# Patient Record
Sex: Female | Born: 1990 | Race: Black or African American | Hispanic: No | Marital: Single | Smoking: Current every day smoker
Health system: Southern US, Community
[De-identification: ages and names within clinical notes are randomized; demographics above are authoritative.]

## PROBLEM LIST (undated history)

## (undated) ENCOUNTER — Inpatient Hospital Stay (HOSPITAL_COMMUNITY): Payer: Self-pay

## (undated) DIAGNOSIS — F419 Anxiety disorder, unspecified: Secondary | ICD-10-CM

## (undated) DIAGNOSIS — F32A Depression, unspecified: Secondary | ICD-10-CM

## (undated) DIAGNOSIS — J302 Other seasonal allergic rhinitis: Secondary | ICD-10-CM

## (undated) DIAGNOSIS — F329 Major depressive disorder, single episode, unspecified: Secondary | ICD-10-CM

## (undated) DIAGNOSIS — D649 Anemia, unspecified: Secondary | ICD-10-CM

## (undated) HISTORY — DX: Major depressive disorder, single episode, unspecified: F32.9

## (undated) HISTORY — DX: Anxiety disorder, unspecified: F41.9

## (undated) HISTORY — PX: NO PAST SURGERIES: SHX2092

## (undated) HISTORY — DX: Depression, unspecified: F32.A

## (undated) HISTORY — DX: Anemia, unspecified: D64.9

---
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
## 2012-05-31 ENCOUNTER — Emergency Department (INDEPENDENT_AMBULATORY_CARE_PROVIDER_SITE_OTHER)
Admission: EM | Admit: 2012-05-31 | Discharge: 2012-05-31 | Disposition: A | Discharge status: Home / Self Care | Payer: Medicaid Other | Source: Home / Self Care | Attending: Emergency Medicine | Admitting: Emergency Medicine

## 2012-05-31 ENCOUNTER — Encounter (HOSPITAL_COMMUNITY): Payer: Self-pay

## 2012-05-31 DIAGNOSIS — J029 Acute pharyngitis, unspecified: Secondary | ICD-10-CM

## 2012-05-31 LAB — POCT RAPID STREP A: Streptococcus, Group A Screen (Direct): NEGATIVE

## 2012-05-31 NOTE — ED Provider Notes (Signed)
History     CSN: [id number]  Arrival date & time 05/31/12  1811   First MD Initiated Contact with Patient 05/31/12 1855      Chief Complaint  Patient presents with  . Sore Throat    (Consider location/radiation/quality/duration/timing/severity/associated sxs/prior treatment) Patient is a 21 y.o. female presenting with pharyngitis. The history is provided by the patient.  Sore Throat This is a new problem. The current episode started more than 2 days ago. The problem occurs daily. The problem has been gradually improving. The symptoms are aggravated by swallowing and drinking (swallowing). Nothing relieves the symptoms. She has tried nothing for the symptoms.  hx of strep many years ago, states symptom are improving, throat feels scratchy in nature.  History reviewed. No pertinent past medical history.  History reviewed. No pertinent past surgical history.  History reviewed. No pertinent family history.  History  Substance Use Topics  . Smoking status: Not on file  . Smokeless tobacco: Not on file  . Alcohol Use: Yes    OB History    Grav Para Term Preterm Abortions TAB SAB Ect Mult Living                  Review of Systems  HENT: Positive for sore throat and trouble swallowing.   All other systems reviewed and are negative.    Allergies  Review of patient's allergies indicates no known allergies.  Home Medications  No current outpatient prescriptions on file.  BP 103/68  Pulse 72  Temp 98.2 F (36.8 C) (Oral)  Resp 24  SpO2 99%  LMP 05/22/2012  Physical Exam  Nursing note and vitals reviewed. Constitutional: She is oriented to person, place, and time. Vital signs are normal. She appears well-developed and well-nourished. She is active and cooperative.  HENT:  Head: Normocephalic.  Right Ear: Tympanic membrane and external ear normal.  Left Ear: Tympanic membrane and external ear normal.  Mouth/Throat: Uvula is midline and mucous membranes are normal.  Oropharyngeal exudate and posterior oropharyngeal edema present.       Minimal erythema  Eyes: Conjunctivae normal are normal. Pupils are equal, round, and reactive to light. No scleral icterus.  Neck: Trachea normal, normal range of motion and full passive range of motion without pain. Neck supple. No muscular tenderness present.  Cardiovascular: Normal rate, regular rhythm, normal heart sounds, intact distal pulses and normal pulses.   Pulmonary/Chest: Effort normal and breath sounds normal.  Lymphadenopathy:       Head (right side): No submental, no submandibular, no tonsillar, no preauricular, no posterior auricular and no occipital adenopathy present.       Head (left side): No submental, no submandibular, no tonsillar, no preauricular, no posterior auricular and no occipital adenopathy present.    She has no cervical adenopathy.  Neurological: She is alert and oriented to person, place, and time. No cranial nerve deficit or sensory deficit.  Skin: Skin is warm and dry.  Psychiatric: She has a normal mood and affect. Her speech is normal and behavior is normal. Judgment and thought content normal. Cognition and memory are normal.    ED Course  Procedures (including critical care time)   Labs Reviewed  POCT RAPID STREP A ([facility] CARE ONLY)  STREP A DNA PROBE   No results found.   1. Pharyngitis       MDM  Rapid strep negative, throat culture sent.  Increase fluids, salt water gargles, antihistamine.        Avey Mcmanamon L  Benisha Hadaway, NP 05/31/12 4098

## 2012-05-31 NOTE — ED Notes (Signed)
"  I think I have strep" ; NAD

## 2012-05-31 NOTE — ED Provider Notes (Signed)
Medical screening examination/treatment/procedure(s) were performed by non-physician practitioner and as supervising physician I was immediately available for consultation/collaboration.  Raynald Blend, MD 05/31/12 2025

## 2012-09-26 ENCOUNTER — Emergency Department (HOSPITAL_COMMUNITY)
Admission: EM | Admit: 2012-09-26 | Discharge: 2012-09-26 | Disposition: A | Discharge status: Home / Self Care | Payer: Medicaid Other | Attending: Emergency Medicine | Admitting: Emergency Medicine

## 2012-09-26 ENCOUNTER — Emergency Department (HOSPITAL_COMMUNITY): Payer: Medicaid Other

## 2012-09-26 ENCOUNTER — Encounter (HOSPITAL_COMMUNITY): Payer: Self-pay

## 2012-09-26 DIAGNOSIS — M25579 Pain in unspecified ankle and joints of unspecified foot: Secondary | ICD-10-CM | POA: Insufficient documentation

## 2012-09-26 DIAGNOSIS — M79672 Pain in left foot: Secondary | ICD-10-CM

## 2012-09-26 NOTE — ED Notes (Signed)
Ortho tech paged  

## 2012-09-26 NOTE — ED Provider Notes (Signed)
History     CSN: [id number]  Arrival date & time 09/26/12  4098   First MD Initiated Contact with Patient 09/26/12 0840      Chief Complaint  Patient presents with  . Foot Pain    (Consider location/radiation/quality/duration/timing/severity/associated sxs/prior treatment) Patient is a 22 y.o. female presenting with lower extremity pain. The history is provided by the patient. No language interpreter was used.  Foot Pain This is a new (pain over mid 4th, 5th metatarsals) problem. The current episode started in the past 7 days. The problem occurs constantly. The problem has been gradually worsening. Pertinent negatives include no abdominal pain, arthralgias, chest pain, chills, congestion, coughing, diaphoresis, fatigue, fever, headaches, nausea, neck pain, numbness, rash, sore throat, vomiting or weakness. The symptoms are aggravated by walking and standing. She has tried nothing for the symptoms. The treatment provided no relief.    History reviewed. No pertinent past medical history.  History reviewed. No pertinent past surgical history.  No family history on file.  History  Substance Use Topics  . Smoking status: Not on file  . Smokeless tobacco: Not on file  . Alcohol Use: Yes    OB History   Grav Para Term Preterm Abortions TAB SAB Ect Mult Living                  Review of Systems  Constitutional: Negative for fever, chills, diaphoresis, activity change, appetite change and fatigue.  HENT: Negative for congestion, sore throat, facial swelling, rhinorrhea, neck pain and neck stiffness.   Eyes: Negative for photophobia and discharge.  Respiratory: Negative for cough, chest tightness and shortness of breath.   Cardiovascular: Negative for chest pain, palpitations and leg swelling.  Gastrointestinal: Negative for nausea, vomiting, abdominal pain and diarrhea.  Endocrine: Negative for polydipsia and polyuria.  Genitourinary: Negative for dysuria, frequency, difficulty  urinating and pelvic pain.  Musculoskeletal: Negative for back pain and arthralgias.  Skin: Negative for color change, rash and wound.  Allergic/Immunologic: Negative for immunocompromised state.  Neurological: Negative for facial asymmetry, weakness, numbness and headaches.  Hematological: Does not bruise/bleed easily.  Psychiatric/Behavioral: Negative for confusion and agitation.    Allergies  Review of patient's allergies indicates no known allergies.  Home Medications  No current outpatient prescriptions on file.  BP 103/72  Pulse 72  Temp(Src) 97.8 F (36.6 C) (Oral)  Resp 20  SpO2 100%  LMP 09/18/2012  Physical Exam  Constitutional: She is oriented to person, place, and time. She appears well-developed and well-nourished. No distress.  HENT:  Head: Normocephalic and atraumatic.  Mouth/Throat: No oropharyngeal exudate.  Eyes: Pupils are equal, round, and reactive to light.  Neck: Normal range of motion. Neck supple.  Cardiovascular: Normal rate, regular rhythm and normal heart sounds.  Exam reveals no gallop and no friction rub.   No murmur heard. Pulmonary/Chest: Effort normal and breath sounds normal. No respiratory distress. She has no wheezes. She has no rales.  Abdominal: Soft. Bowel sounds are normal. She exhibits no distension and no mass. There is no tenderness. There is no rebound and no guarding.  Musculoskeletal: Normal range of motion. She exhibits tenderness. She exhibits no edema.       Feet:  Neurological: She is alert and oriented to person, place, and time.  Skin: Skin is warm and dry.  Psychiatric: She has a normal mood and affect.    ED Course  Procedures (including critical care time)  Labs Reviewed - No data to display Dg Foot Complete  Left  09/26/2012  *RADIOLOGY REPORT*  Clinical Data: Pain over mid fourth/fifth metatarsals, no known injury  LEFT FOOT - COMPLETE 3+ VIEW  Comparison: None.  Findings: No fracture or dislocation is seen.  The  joint spaces are preserved.  The visualized soft tissues are unremarkable.  IMPRESSION: No fracture or dislocation is seen.   Original Report Authenticated By: Charline Bills, M.D.      1. Left foot pain       MDM  Pt is a 22 y.o. female with no pertinent PMHX who presents with 4 days of worsening L midfoot pain.  No known injury, but pt on feet at work & does a lot of walking.  No fever, s/sx of infection.  +ttp over 4th & 5th L metatarsal.  XR negative.  Doubt acute infection, DVT, foreign body.  Will d/c home in post-op boot, recommend NSAIDs and f/u in 1 week is pain persists as cannot r/o small stress fracture.    1. Left foot pain      Labs and imaging considered in decision making, reviewed by myself.  Imaging interpreted by radiology. Pt care discussed with my attending, Dr. Hyacinth Meeker.    Toy Cookey, MD 09/26/12 [phone]

## 2012-09-26 NOTE — Progress Notes (Signed)
Orthopedic Tech Progress Note Patient Details:  Maurie Musco [place] 23-Sep-1990 [id number] Post op shoe applied to Left foot. Tolerated well.  Ortho Devices Type of Ortho Device: Postop shoe/boot Ortho Device/Splint Location: Left Ortho Device/Splint Interventions: Application   Asia R Thompson 09/26/2012, 10:33 AM

## 2012-09-26 NOTE — ED Notes (Addendum)
Pt states she walks a lot and when she woke up this morning she noticed swelling to the left foot.  Pt denies any injury.

## 2012-09-26 NOTE — ED Provider Notes (Signed)
I saw and evaluated the patient, reviewed the resident's note and I agree with the findings and plan.  Pt with L foot pain on the dorsum of the foot - mild pain, worse with light touch and ambulation - no fever, no swelling, no redness, no other c/o.  No hx of trauma.  Imaging neg for fracture.  Stable for d/c.  Vida Roller, MD 09/26/12 2147

## 2014-02-17 ENCOUNTER — Encounter (HOSPITAL_COMMUNITY): Payer: Self-pay | Admitting: Emergency Medicine

## 2014-02-17 ENCOUNTER — Emergency Department (HOSPITAL_COMMUNITY)
Admission: EM | Admit: 2014-02-17 | Discharge: 2014-02-17 | Discharge status: Against Medical Advice | Payer: Medicaid Other | Attending: Emergency Medicine | Admitting: Emergency Medicine

## 2014-02-17 DIAGNOSIS — S90569A Insect bite (nonvenomous), unspecified ankle, initial encounter: Secondary | ICD-10-CM | POA: Insufficient documentation

## 2014-02-17 DIAGNOSIS — Y9389 Activity, other specified: Secondary | ICD-10-CM | POA: Diagnosis not present

## 2014-02-17 DIAGNOSIS — Y9289 Other specified places as the place of occurrence of the external cause: Secondary | ICD-10-CM | POA: Insufficient documentation

## 2014-02-17 DIAGNOSIS — W57XXXA Bitten or stung by nonvenomous insect and other nonvenomous arthropods, initial encounter: Principal | ICD-10-CM

## 2014-02-17 NOTE — ED Notes (Signed)
Unable to locate pt to take to treatment room. Found pt's pager lying on counter with BP cuff.

## 2014-02-17 NOTE — ED Notes (Signed)
C/o "spider" bite to L thigh x 45 min.  States she did not see "spider" but felt it bite her.  Area slightly red.

## 2014-03-21 ENCOUNTER — Encounter (HOSPITAL_COMMUNITY): Payer: Self-pay | Admitting: Emergency Medicine

## 2014-03-21 ENCOUNTER — Emergency Department (HOSPITAL_COMMUNITY)
Admission: EM | Admit: 2014-03-21 | Discharge: 2014-03-21 | Disposition: A | Discharge status: Home / Self Care | Payer: Medicaid Other | Attending: Emergency Medicine | Admitting: Emergency Medicine

## 2014-03-21 DIAGNOSIS — L0291 Cutaneous abscess, unspecified: Secondary | ICD-10-CM

## 2014-03-21 DIAGNOSIS — F17200 Nicotine dependence, unspecified, uncomplicated: Secondary | ICD-10-CM | POA: Insufficient documentation

## 2014-03-21 DIAGNOSIS — L02419 Cutaneous abscess of limb, unspecified: Secondary | ICD-10-CM | POA: Insufficient documentation

## 2014-03-21 DIAGNOSIS — L03119 Cellulitis of unspecified part of limb: Principal | ICD-10-CM

## 2014-03-21 MED ORDER — NAPROXEN 500 MG PO TABS: 500.0000 mg | ORAL_TABLET | Freq: Two times a day (BID) | ORAL | Status: DC

## 2014-03-21 NOTE — ED Notes (Signed)
C/o "boil" right posterior thigh. Large swollen area to posterior thigh.

## 2014-03-21 NOTE — ED Provider Notes (Signed)
CSN: [id number]     Arrival date & time 03/21/14  0949 History  This chart was scribed for non-physician practitioner, Rhea Bleacher, PA-C working with Flint Melter, MD by Greggory Stallion, ED scribe. This patient was seen in room TR08C/TR08C and the patient's care was started at 9:59 AM.    Chief Complaint  Patient presents with  . Abscess   The history is provided by the patient. No language interpreter was used.   HPI Comments: Michelle Shah is a 23 y.o. female who presents to the Emergency Department complaining of an abscess to her right thigh that started 3 days ago. Reports pain is worsened with pressure. She has not done anything for it yet. Reports some drainage. Pt has history of abscesses but has never had them I&D'd. Denies fever, nausea, emesis.   History reviewed. No pertinent past medical history. History reviewed. No pertinent past surgical history. No family history on file. History  Substance Use Topics  . Smoking status: Current Every Day Smoker  . Smokeless tobacco: Not on file  . Alcohol Use: Yes   OB History   Grav Para Term Preterm Abortions TAB SAB Ect Mult Living                 Review of Systems  Constitutional: Negative for fever.  HENT: Negative for congestion.   Eyes: Negative for redness.  Respiratory: Negative for shortness of breath.   Cardiovascular: Negative for chest pain.  Gastrointestinal: Negative for nausea and vomiting.  Musculoskeletal: Negative for gait problem.  Skin: Positive for color change.       Abscess  Neurological: Negative for speech difficulty.  Psychiatric/Behavioral: Negative for confusion.   Allergies  Review of patient's allergies indicates no known allergies.  Home Medications   Prior to Admission medications   Not on File   BP 126/60  Pulse 93  Temp(Src) 98.2 F (36.8 C) (Oral)  SpO2 100%  LMP 02/19/2014  Physical Exam  Nursing note and vitals reviewed. Constitutional: She is oriented to person, place,  and time. She appears well-developed and well-nourished. No distress.  HENT:  Head: Normocephalic and atraumatic.  Eyes: Conjunctivae and EOM are normal.  Neck: Neck supple. No tracheal deviation present.  Cardiovascular: Normal rate.   Pulmonary/Chest: Effort normal. No respiratory distress.  Musculoskeletal: Normal range of motion.  Neurological: She is alert and oriented to person, place, and time.  Skin: Skin is warm and dry.  2 cm area of induration right medial thigh with out surrounding cellulitis consistent with abscess.  Psychiatric: She has a normal mood and affect. Her behavior is normal.    ED Course  Procedures (including critical care time)  INCISION AND DRAINAGE Performed by: Rhea Bleacher, PA-C Consent: Verbal consent obtained. Risks and benefits: risks, benefits and alternatives were discussed Type: abscess  Body area: right thigh  Anesthesia: local infiltration  Incision was made with a scalpel.  Local anesthetic: lidocaine 2% with epinephrine  Anesthetic total: 4 ml  Complexity: complex Blunt dissection to break up loculations  Drainage: purulent  Drainage amount: small; abscess previously draining spontaneously  Packing material: none  Patient tolerance: Patient tolerated the procedure well with no immediate complications.   DIAGNOSTIC STUDIES: Oxygen Saturation is 100% on RA, normal by my interpretation.    COORDINATION OF CARE: 10:01 AM-Discussed treatment plan which includes abscess with pt at bedside and pt agreed to plan.   Labs Review Labs Reviewed - No data to display  Imaging Review No results  found.   EKG Interpretation None      Vital signs reviewed and are as follows: Filed Vitals:   03/21/14 0957  BP: 126/60  Pulse: 93  Temp: 98.2 F (36.8 C)     10:40 AM The patient was urged to return to the Emergency Department urgently with worsening pain, swelling, expanding erythema especially if it streaks away from the  affected area, fever, or if they have any other concerns.   The patient was urged to return to the Emergency Department or go to their PCP in 48 hours for wound recheck if the area is not significantly improved.  The patient verbalized understanding and stated agreement with this plan.      MDM   Final diagnoses:  Abscess   Patient with skin abscess amenable to incision and drainage.No signs of cellulitis is surrounding skin. No antibiotic therapy is indicated.  I personally performed the services described in this documentation, which was scribed in my presence. The recorded information has been reviewed and is accurate.  Renne Crigler, PA-C 03/21/14 1041

## 2014-03-21 NOTE — Discharge Instructions (Signed)
Please read and follow all provided instructions.  Your diagnoses today include:  1. Abscess     Tests performed today include:  Vital signs. See below for your results today.   Medications prescribed:   Naproxen - anti-inflammatory pain medication  Do not exceed  naproxen every 12 hours, take with food  You have been prescribed an anti-inflammatory medication or NSAID. Take with food. Take smallest effective dose for the shortest duration needed for your pain. Stop taking if you experience stomach pain or vomiting.   Take any prescribed medications only as directed.   Home care instructions:   Follow any educational materials contained in this packet  Follow-up instructions: Return to the Emergency Department in 48 hours for a recheck if your symptoms are not significantly improved.  Please follow-up with your primary care provider in the next 1 week for further evaluation of your symptoms.   Return instructions:  Return to the Emergency Department if you have:  Fever  Worsening symptoms  Worsening pain  Worsening swelling  Redness of the skin that moves away from the affected area, especially if it streaks away from the affected area   Any other emergent concerns   Your vital signs today were: BP 126/60   Pulse 93   Temp(Src) 98.2 F (36.8 C) (Oral)   SpO2 100%   LMP 02/19/2014 If your blood pressure (BP) was elevated above 135/85 this visit, please have this repeated by your doctor within one month. --------------

## 2014-03-21 NOTE — ED Provider Notes (Signed)
Medical screening examination/treatment/procedure(s) were performed by non-physician practitioner and as supervising physician I was immediately available for consultation/collaboration.   EKG Interpretation None       Flint Melter, MD 03/21/14 2020

## 2014-10-28 ENCOUNTER — Encounter (HOSPITAL_COMMUNITY): Payer: Self-pay | Admitting: *Deleted

## 2014-10-28 ENCOUNTER — Emergency Department (HOSPITAL_COMMUNITY)
Admission: EM | Admit: 2014-10-28 | Discharge: 2014-10-28 | Discharge status: Against Medical Advice | Payer: Medicaid Other | Attending: Emergency Medicine | Admitting: Emergency Medicine

## 2014-10-28 DIAGNOSIS — Z720 Tobacco use: Secondary | ICD-10-CM | POA: Insufficient documentation

## 2014-10-28 DIAGNOSIS — Z202 Contact with and (suspected) exposure to infections with a predominantly sexual mode of transmission: Secondary | ICD-10-CM | POA: Insufficient documentation

## 2014-10-28 LAB — URINALYSIS, ROUTINE W REFLEX MICROSCOPIC
BILIRUBIN URINE: NEGATIVE
GLUCOSE, UA: NEGATIVE mg/dL
Hgb urine dipstick: NEGATIVE
Ketones, ur: NEGATIVE mg/dL
Nitrite: NEGATIVE
PH: 6.5 (ref 5.0–8.0)
PROTEIN: NEGATIVE mg/dL
Specific Gravity, Urine: 1.029 (ref 1.005–1.030)
UROBILINOGEN UA: 1 mg/dL (ref 0.0–1.0)

## 2014-10-28 LAB — URINE MICROSCOPIC-ADD ON

## 2014-10-28 LAB — POC URINE PREG, ED: Preg Test, Ur: NEGATIVE

## 2014-10-28 NOTE — ED Notes (Signed)
Pt left before being seen. Pt sts she had to get to work.

## 2014-10-28 NOTE — ED Notes (Signed)
Pt reports unprotected sex in march, reports now her vaginal area feels "funny" x3 weeks. Reports burning feeling, but denies pain. Denies vaginal discharge.

## 2014-12-05 ENCOUNTER — Emergency Department (HOSPITAL_COMMUNITY)
Admission: EM | Admit: 2014-12-05 | Discharge: 2014-12-05 | Disposition: A | Discharge status: Home / Self Care | Payer: Medicaid Other | Attending: Emergency Medicine | Admitting: Emergency Medicine

## 2014-12-05 ENCOUNTER — Encounter (HOSPITAL_COMMUNITY): Payer: Self-pay

## 2014-12-05 DIAGNOSIS — Z791 Long term (current) use of non-steroidal anti-inflammatories (NSAID): Secondary | ICD-10-CM | POA: Diagnosis not present

## 2014-12-05 DIAGNOSIS — Z720 Tobacco use: Secondary | ICD-10-CM | POA: Insufficient documentation

## 2014-12-05 DIAGNOSIS — J029 Acute pharyngitis, unspecified: Secondary | ICD-10-CM | POA: Diagnosis present

## 2014-12-05 HISTORY — DX: Other seasonal allergic rhinitis: J30.2

## 2014-12-05 MED ORDER — AMOXICILLIN 250 MG PO CHEW: 500.0000 mg | CHEWABLE_TABLET | Freq: Three times a day (TID) | ORAL | Status: DC

## 2014-12-05 MED ORDER — LIDOCAINE VISCOUS 2 % MT SOLN
15.0000 mL | Freq: Once | OROMUCOSAL | Status: AC
  Administered 2014-12-05: 15 mL via OROMUCOSAL
  Filled 2014-12-05: qty 15

## 2014-12-05 MED ORDER — LIDOCAINE VISCOUS 2 % MT SOLN: 20.0000 mL | OROMUCOSAL | Status: DC | PRN

## 2014-12-05 NOTE — ED Notes (Signed)
PA Abigail at the bedside.  

## 2014-12-05 NOTE — ED Notes (Signed)
Patient calm. States the medication helped. Breathing normal. No acute distress at this time.

## 2014-12-05 NOTE — Discharge Instructions (Signed)

## 2014-12-05 NOTE — ED Notes (Signed)
Per Patient, Patient started to have a cold about a week and a half ago complaints containing running nose, congestion, and sneezing. About a week ago, patient states she started to have a dry productive cough. Starting about four days ago, patient started to have pain in her throat that has continued to get worse. Patient no longer has cough, but complains of pain when swallowing.

## 2014-12-05 NOTE — ED Notes (Signed)
Walked in to patient room. Patient standing in the corner of the room crying and asked for a paper bag. Patient encouraged to slow breathing down and sit down in the chair. Patient made aware she is going to be given something for pain.

## 2014-12-05 NOTE — ED Provider Notes (Signed)
CSN: [id number]     Arrival date & time 12/05/14  1042 History  This chart was scribed for non-physician practitioner working, Arthor CaptainAbigail Maris Abascal, PA-C, with Benjiman CoreNathan Pickering, MD, by Modena JanskyAlbert Thayil, ED Scribe. This patient was seen in room TR08C/TR08C and the patient's care was started at 11:18 AM.  Chief Complaint  Patient presents with  . Sore Throat   The history is provided by the patient. No language interpreter was used.   HPI Comments: Michelle Shah is a 24 y.o. female who presents to the Emergency Department complaining of constant moderate sore throat that started 4 days ago. She states that she recently had a URI that is now resolved, but then later developed a sore throat with an intermittent mild subjective fever. Pt's temperature in the ED today was 97.8. She reports that swallowing exacerbates the pain. She states that the pain is worse in the morning. She denies any hx of strep throat.   No past medical history on file. No past surgical history on file. No family history on file. History  Substance Use Topics  . Smoking status: Current Every Day Smoker  . Smokeless tobacco: Not on file  . Alcohol Use: Yes     Comment: weekly   OB History    No data available     Review of Systems  Constitutional: Positive for fever.  HENT: Positive for sore throat.     Allergies  Review of patient's allergies indicates no known allergies.  Home Medications   Prior to Admission medications   Medication Sig Start Date End Date Taking? Authorizing Provider  naproxen (NAPROSYN) 500 MG tablet Take 1 tablet (500 mg total) by mouth 2 (two) times daily. 03/21/14   Renne CriglerJoshua Geiple, PA-C   BP 109/65 mmHg  Pulse 65  Temp(Src) 97.8 F (36.6 C) (Oral)  Resp 14  SpO2 100%  LMP 12/04/2014 Physical Exam  Constitutional: She is oriented to person, place, and time. She appears well-developed and well-nourished. No distress.  HENT:  Head: Normocephalic and atraumatic.  Mouth/Throat: Uvula is  midline. No trismus in the jaw. Oropharyngeal exudate and posterior oropharyngeal erythema present.  Tolerating own secretions.   Neck: Neck supple. No tracheal deviation present.  Cardiovascular: Normal rate.   Pulmonary/Chest: Effort normal. No respiratory distress.  Musculoskeletal: Normal range of motion.  Lymphadenopathy:    She has cervical adenopathy.  Neurological: She is alert and oriented to person, place, and time.  Skin: Skin is warm and dry.  Psychiatric: She has a normal mood and affect. Her behavior is normal.  Nursing note and vitals reviewed.   ED Course  Procedures (including critical care time) DIAGNOSTIC STUDIES: Oxygen Saturation is 100% on RA, normal by my interpretation.    COORDINATION OF CARE: 11:22 AM- Pt advised of plan for treatment which includes medication and pt agrees.  Labs Review Labs Reviewed - No data to display  Imaging Review No results found.   EKG Interpretation None      MDM   Final diagnoses:  Pharyngitis   Patient with sore throat, low grade temp, cervical adenopathy and throat covered in exudate. Will treat with amoxil. Patient pain improved with viscous lidocaine. Discussed return precautions  I personally performed the services described in this documentation, which was scribed in my presence. The recorded information has been reviewed and is accurate.       Arthor CaptainAbigail Sundy Houchins, PA-C 12/17/14 1924  Arthor CaptainAbigail Ezma Rehm, PA-C 12/17/14 1925  Benjiman CoreNathan Pickering, MD 12/18/14 [phone]

## 2015-07-20 NOTE — L&D Delivery Note (Signed)
Operative Delivery Note At 7:42 AM a viable female was delivered via Vaginal, [employer], operational).  Presentation: vertex; Position: Occiput,, Posterior; Station: +4.  Verbal consent: obtained from patient.  Risks and benefits discussed in detail.  Risks include, but are not limited to the risks of anesthesia, bleeding, infection, damage to maternal tissues, fetal cephalhematoma.  There is also the risk of inability to effect vaginal delivery of the head, or shoulder dystocia that cannot be resolved by established maneuvers, leading to the need for emergency cesarean section.  APGAR: 7, 9; weight  .   Placenta status:spont , .   Cord:  with the following complications: .  Cord pH: n/a  Anesthesia:  Epidural/local Instruments: kiwi Episiotomy: None Lacerations: 2nd degree Suture Repair: 2.0 vicryl rapide Est. Blood Loss (mL):    Mom to postpartum.  Baby to Couplet care / Skin to Skin.  Michelle Shah 02/16/2016, 8:03 AM

## 2015-07-30 ENCOUNTER — Emergency Department (HOSPITAL_COMMUNITY): Payer: Medicaid Other

## 2015-07-30 ENCOUNTER — Encounter (HOSPITAL_COMMUNITY): Payer: Self-pay | Admitting: Emergency Medicine

## 2015-07-30 ENCOUNTER — Emergency Department (HOSPITAL_COMMUNITY)
Admission: EM | Admit: 2015-07-30 | Discharge: 2015-07-30 | Disposition: A | Discharge status: Home / Self Care | Payer: Medicaid Other | Attending: Emergency Medicine | Admitting: Emergency Medicine

## 2015-07-30 DIAGNOSIS — Z3A09 9 weeks gestation of pregnancy: Secondary | ICD-10-CM | POA: Insufficient documentation

## 2015-07-30 DIAGNOSIS — S3991XA Unspecified injury of abdomen, initial encounter: Secondary | ICD-10-CM | POA: Insufficient documentation

## 2015-07-30 DIAGNOSIS — S20212A Contusion of left front wall of thorax, initial encounter: Secondary | ICD-10-CM | POA: Insufficient documentation

## 2015-07-30 DIAGNOSIS — Z792 Long term (current) use of antibiotics: Secondary | ICD-10-CM | POA: Insufficient documentation

## 2015-07-30 DIAGNOSIS — O9A211 Injury, poisoning and certain other consequences of external causes complicating pregnancy, first trimester: Secondary | ICD-10-CM | POA: Diagnosis present

## 2015-07-30 DIAGNOSIS — Y9389 Activity, other specified: Secondary | ICD-10-CM | POA: Diagnosis not present

## 2015-07-30 DIAGNOSIS — Y92410 Unspecified street and highway as the place of occurrence of the external cause: Secondary | ICD-10-CM | POA: Insufficient documentation

## 2015-07-30 DIAGNOSIS — Y998 Other external cause status: Secondary | ICD-10-CM | POA: Insufficient documentation

## 2015-07-30 DIAGNOSIS — F17200 Nicotine dependence, unspecified, uncomplicated: Secondary | ICD-10-CM | POA: Diagnosis not present

## 2015-07-30 DIAGNOSIS — S1093XA Contusion of unspecified part of neck, initial encounter: Secondary | ICD-10-CM | POA: Diagnosis not present

## 2015-07-30 DIAGNOSIS — R10A0 Flank pain, unspecified side: Secondary | ICD-10-CM

## 2015-07-30 DIAGNOSIS — R109 Unspecified abdominal pain: Secondary | ICD-10-CM

## 2015-07-30 DIAGNOSIS — O99331 Smoking (tobacco) complicating pregnancy, first trimester: Secondary | ICD-10-CM | POA: Diagnosis not present

## 2015-07-30 LAB — CBC WITH DIFFERENTIAL/PLATELET
Basophils Absolute: 0 K/uL (ref 0.0–0.1)
Basophils Relative: 0 %
Eosinophils Absolute: 0.1 K/uL (ref 0.0–0.7)
Eosinophils Relative: 1 %
HCT: 33.1 % — ABNORMAL LOW (ref 36.0–46.0)
Hemoglobin: 11.1 g/dL — ABNORMAL LOW (ref 12.0–15.0)
Lymphocytes Relative: 18 %
Lymphs Abs: 1.7 K/uL (ref 0.7–4.0)
MCH: 27.8 pg (ref 26.0–34.0)
MCHC: 33.5 g/dL (ref 30.0–36.0)
MCV: 82.8 fL (ref 78.0–100.0)
Monocytes Absolute: 0.5 K/uL (ref 0.1–1.0)
Monocytes Relative: 5 %
Neutro Abs: 7.2 K/uL (ref 1.7–7.7)
Neutrophils Relative %: 76 %
Platelets: 240 K/uL (ref 150–400)
RBC: 4 MIL/uL (ref 3.87–5.11)
RDW: 12.8 % (ref 11.5–15.5)
WBC: 9.4 K/uL (ref 4.0–10.5)

## 2015-07-30 LAB — BASIC METABOLIC PANEL WITH GFR
Anion gap: 8 (ref 5–15)
BUN: 6 mg/dL (ref 6–20)
CO2: 20 mmol/L — ABNORMAL LOW (ref 22–32)
Calcium: 8.9 mg/dL (ref 8.9–10.3)
Chloride: 107 mmol/L (ref 101–111)
Creatinine, Ser: 0.51 mg/dL (ref 0.44–1.00)
GFR calc Af Amer: >60 mL/min
GFR calc non Af Amer: >60 mL/min
Glucose, Bld: 85 mg/dL (ref 65–99)
Potassium: 3.8 mmol/L (ref 3.5–5.1)
Sodium: 135 mmol/L (ref 135–145)

## 2015-07-30 LAB — HCG, QUANTITATIVE, PREGNANCY: hCG, Beta Chain, Quant, S: 31335 m[IU]/mL — ABNORMAL HIGH

## 2015-07-30 LAB — POC URINE PREG, ED: Preg Test, Ur: POSITIVE — AB

## 2015-07-30 MED ORDER — ACETAMINOPHEN 500 MG PO TABS
1000.0000 mg | ORAL_TABLET | Freq: Once | ORAL | Status: AC
  Administered 2015-07-30: 1000 mg via ORAL
  Filled 2015-07-30: qty 2

## 2015-07-30 MED ORDER — MORPHINE SULFATE (PF) 4 MG/ML IV SOLN
4.0000 mg | Freq: Once | INTRAVENOUS | Status: AC
  Administered 2015-07-30: 4 mg via INTRAVENOUS
  Filled 2015-07-30: qty 1

## 2015-07-30 MED ORDER — ACETAMINOPHEN 500 MG PO TABS: 500.0000 mg | ORAL_TABLET | Freq: Once | ORAL | Status: DC

## 2015-07-30 MED ORDER — METOCLOPRAMIDE HCL 5 MG/ML IJ SOLN
5.0000 mg | Freq: Once | INTRAMUSCULAR | Status: AC
  Administered 2015-07-30: 5 mg via INTRAVENOUS
  Filled 2015-07-30: qty 2

## 2015-07-30 MED ORDER — METOCLOPRAMIDE HCL 10 MG PO TABS: 10.0000 mg | ORAL_TABLET | Freq: Four times a day (QID) | ORAL | Status: DC | PRN

## 2015-07-30 MED ORDER — PRENATAL COMPLETE 14-0.4 MG PO TABS: 1.0000 | ORAL_TABLET | ORAL | Status: DC

## 2015-07-30 NOTE — ED Notes (Addendum)
Pt. Found in hall way bed tearful and c/o chest pain. EDP notified. EDP verbally ordered morphine for pain.
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
## 2015-07-30 NOTE — ED Notes (Signed)
Pt refused wheelchair.  
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
## 2015-07-30 NOTE — ED Notes (Signed)
Patient transported to MRI at this time. 

## 2015-08-08 ENCOUNTER — Inpatient Hospital Stay (HOSPITAL_COMMUNITY)
Admission: AD | Admit: 2015-08-08 | Discharge: 2015-08-08 | Disposition: A | Discharge status: Home / Self Care | Payer: Medicaid Other | Source: Ambulatory Visit | Attending: Obstetrics and Gynecology | Admitting: Obstetrics and Gynecology

## 2015-08-08 ENCOUNTER — Encounter (HOSPITAL_COMMUNITY): Payer: Self-pay | Admitting: Advanced Practice Midwife

## 2015-08-08 ENCOUNTER — Inpatient Hospital Stay (HOSPITAL_COMMUNITY): Payer: Medicaid Other

## 2015-08-08 DIAGNOSIS — O99331 Smoking (tobacco) complicating pregnancy, first trimester: Secondary | ICD-10-CM | POA: Diagnosis not present

## 2015-08-08 DIAGNOSIS — M542 Cervicalgia: Secondary | ICD-10-CM | POA: Diagnosis present

## 2015-08-08 DIAGNOSIS — S299XXA Unspecified injury of thorax, initial encounter: Secondary | ICD-10-CM | POA: Insufficient documentation

## 2015-08-08 DIAGNOSIS — N63 Unspecified lump in breast: Secondary | ICD-10-CM | POA: Diagnosis not present

## 2015-08-08 DIAGNOSIS — S298XXD Other specified injuries of thorax, subsequent encounter: Secondary | ICD-10-CM

## 2015-08-08 DIAGNOSIS — O26891 Other specified pregnancy related conditions, first trimester: Secondary | ICD-10-CM | POA: Insufficient documentation

## 2015-08-08 DIAGNOSIS — N6310 Unspecified lump in the right breast, unspecified quadrant: Secondary | ICD-10-CM

## 2015-08-08 DIAGNOSIS — R079 Chest pain, unspecified: Secondary | ICD-10-CM

## 2015-08-08 DIAGNOSIS — S299XXD Unspecified injury of thorax, subsequent encounter: Secondary | ICD-10-CM

## 2015-08-08 DIAGNOSIS — Z3A10 10 weeks gestation of pregnancy: Secondary | ICD-10-CM | POA: Insufficient documentation

## 2015-08-08 DIAGNOSIS — O9A211 Injury, poisoning and certain other consequences of external causes complicating pregnancy, first trimester: Secondary | ICD-10-CM

## 2015-08-08 LAB — URINALYSIS, ROUTINE W REFLEX MICROSCOPIC
Bilirubin Urine: NEGATIVE
Glucose, UA: NEGATIVE mg/dL
Hgb urine dipstick: NEGATIVE
KETONES UR: 15 mg/dL — AB
Nitrite: NEGATIVE
PROTEIN: NEGATIVE mg/dL
Specific Gravity, Urine: >1.03 — ABNORMAL HIGH (ref 1.005–1.030)
pH: 6.5 (ref 5.0–8.0)

## 2015-08-08 LAB — URINE MICROSCOPIC-ADD ON

## 2015-08-08 MED ORDER — TRAMADOL HCL 50 MG PO TABS: 50.0000 mg | ORAL_TABLET | Freq: Four times a day (QID) | ORAL | Status: DC | PRN

## 2015-08-08 MED ORDER — CYCLOBENZAPRINE HCL 10 MG PO TABS: 10.0000 mg | ORAL_TABLET | Freq: Three times a day (TID) | ORAL | Status: DC | PRN

## 2015-08-08 MED ORDER — CYCLOBENZAPRINE HCL 10 MG PO TABS
10.0000 mg | ORAL_TABLET | Freq: Once | ORAL | Status: AC
  Administered 2015-08-08: 10 mg via ORAL
  Filled 2015-08-08: qty 1

## 2015-08-08 NOTE — Discharge Instructions (Signed)
________________________________________     To schedule your Maternity Eligibility Appointment, please call [phone].  When you arrive for your appointment you must bring the following items or information listed below.  Your appointment will be rescheduled if you do not have these items or are 15 minutes late. If currently receiving Medicaid, you MUST bring: 1. Medicaid Card 2. Social Security Card 3. Picture ID 4. Proof of Pregnancy 5. Verification of current address if the address on Medicaid card is incorrect "postmarked mail" If not receiving Medicaid, you MUST bring: 1. Social Security Card 2. Picture ID 3. Birth Certificate (if available) Passport or *Green Card 4. Proof of Pregnancy 5. Verification of current address "postmarked mail" for each income presented. 6. Verification of insurance coverage, if any 7. Check stubs from each employer for the previous month (if unable to present check stub  for each week, we will accept check stub for the first and last week ill the same month.) If you can't locate check stubs, you must bring a letter from the employer(s) and it must have the following information on letterhead, typed, in English: o name of company o company telephone number o how long been with the company, if less than one month o how much person earns per hour o how many hours per week work o the gross pay the person earned for the previous month If you are 23 years old or less, you do not have to bring proof of income unless you work or live with the father of the baby and at that time we will need proof of income from you and/or the father of the baby. Green Card recipients are eligible for Medicaid for Pregnant Women (MPW)  Chest Wall Pain Chest wall pain is pain in or around the bones and muscles of your chest. Sometimes, an injury causes this pain. Sometimes, the cause may not be known. This pain may take several weeks or longer to get better. HOME CARE  INSTRUCTIONS  Pay attention to any changes in your symptoms. Take these actions to help with your pain:   Rest as told by your health care provider.   Avoid activities that cause pain. These include any activities that use your chest muscles or your abdominal and side muscles to lift heavy items.   If directed, apply ice to the painful area:  Put ice in a plastic bag.  Place a towel between your skin and the bag.  Leave the ice on for 20 minutes, 2-3 times per day.  Take over-the-counter and prescription medicines only as told by your health care provider.  Do not use tobacco products, including cigarettes, chewing tobacco, and e-cigarettes. If you need help quitting, ask your health care provider.  Keep all follow-up visits as told by your health care provider. This is important. SEEK MEDICAL CARE IF:  You have a fever.  Your chest pain becomes worse.  You have new symptoms. SEEK IMMEDIATE MEDICAL CARE IF:  You have nausea or vomiting.  You feel sweaty or light-headed.  You have a cough with phlegm (sputum) or you cough up blood.  You develop shortness of breath.   This information is not intended to replace advice given to you by your health care provider. Make sure you discuss any questions you have with your health care provider.   Document Released: 07/05/2005 Document Revised: 03/26/2015 Document Reviewed: 09/30/2014 Elsevier Interactive Patient Education 2016 [employer] Inc. Breast Cyst A breast cyst is a sac in the  breast that is filled with fluid. Breast cysts are common in women. Women can have one or many cysts. When the breasts contain many cysts, it is usually due to a noncancerous (benign) condition called fibrocystic change. These lumps form under the influence of female hormones (estrogen and progesterone). The lumps are most often located in the upper, outer portion of the breast. They are often more swollen, painful, and tender before your period starts.  They usually disappear after menopause, unless you are on hormone therapy.  There are several types of cysts:  Macrocyst. This is a cyst that is about 2 in. (5.1 cm) in diameter.   Microcyst. This is a tiny cyst that you cannot feel but can be seen with a mammogram or an ultrasound.   Galactocele. This is a cyst containing milk that may develop if you suddenly stop breastfeeding.   Sebaceous cyst of the skin. This type of cyst is not in the breast tissue itself. Breast cysts do not increase your risk of breast cancer. However, they must be monitored closely because they can be cancerous.  CAUSES  It is not known exactly what causes a breast cyst to form. Possible causes include:  An overgrowth of milk glands and connective tissue in the breast can block the milk glands, causing them to fill with fluid.   Scar tissue in the breast from previous surgery may block the glands, causing a cyst.  RISK FACTORS Estrogen may influence the development of a breast cyst.  SIGNS AND SYMPTOMS   Feeling a smooth, round, soft lump (like a grape) in the breast that is easily moveable.   Breast discomfort or pain.  Increase in size of the lump before your menstrual period and decrease in its size after your menstrual period.  DIAGNOSIS  A cyst can be felt during a physical exam by your health care provider. A breast X-ray exam (mammogram) and ultrasonography will be done to confirm the diagnosis. Fluid may be removed from the cyst with a needle (fine needle aspiration) to make sure the cyst is not cancerous.  TREATMENT  Treatment may not be necessary. Your health care provider may monitor the cyst to see if it goes away on its own. If treatment is needed, it may include:  Hormone treatment.   Needle aspiration. There is a chance of the cyst coming back after aspiration.   Surgery to remove the whole cyst.  HOME CARE INSTRUCTIONS   Keep all follow-up appointments with your health care  provider.  See your health care provider regularly:  Get a yearly exam by your health care provider.  Have a clinical breast exam by a health care provider every 1-3 years if you are 23-66 years of age. After age 23 years, you should have the exam every year.   Get mammogram tests as directed by your health care provider.   Understand the normal appearance and feel of your breasts and perform breast self-exams.   Only take over-the-counter or prescription medicines as directed by your health care provider.   Wear a supportive bra, especially when exercising.   Avoid caffeine.   Reduce your salt intake, especially before your menstrual period. Too much salt can cause fluid retention, breast swelling, and discomfort.  SEEK MEDICAL CARE IF:   You feel, or think you feel, a lump in your breast.   You notice that both breasts look or feel different than usual.   Your breast is still causing pain after your menstrual period  is over.   You need medicine for breast pain and swelling that occurs with your menstrual period.  SEEK IMMEDIATE MEDICAL CARE IF:   You have severe pain, tenderness, redness, or warmth in your breast.   You have nipple discharge or bleeding.   Your breast lump becomes hard and painful.   You find new lumps or bumps that were not there before.   You feel lumps in your armpit (axilla).   You notice dimpling or wrinkling of the breast or nipple.   You have a fever.  MAKE SURE YOU:  Understand these instructions.  Will watch your condition.  Will get help right away if you are not doing well or get worse.   This information is not intended to replace advice given to you by your health care provider. Make sure you discuss any questions you have with your health care provider.   Document Released: 07/05/2005 Document Revised: 03/07/2013 Document Reviewed: 02/01/2013 Elsevier Interactive Patient Education [employer]
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
## 2015-09-04 ENCOUNTER — Encounter: Payer: Self-pay | Admitting: Obstetrics and Gynecology

## 2015-09-04 ENCOUNTER — Ambulatory Visit (INDEPENDENT_AMBULATORY_CARE_PROVIDER_SITE_OTHER): Payer: Self-pay | Admitting: Obstetrics and Gynecology

## 2015-09-04 VITALS — BP 113/64 | HR 94 | Temp 98.7°F | Wt 195.4 lb

## 2015-09-04 DIAGNOSIS — Z1151 Encounter for screening for human papillomavirus (HPV): Secondary | ICD-10-CM

## 2015-09-04 DIAGNOSIS — Z124 Encounter for screening for malignant neoplasm of cervix: Secondary | ICD-10-CM

## 2015-09-04 DIAGNOSIS — Z113 Encounter for screening for infections with a predominantly sexual mode of transmission: Secondary | ICD-10-CM

## 2015-09-04 DIAGNOSIS — Z3400 Encounter for supervision of normal first pregnancy, unspecified trimester: Secondary | ICD-10-CM | POA: Insufficient documentation

## 2015-09-04 DIAGNOSIS — Z3402 Encounter for supervision of normal first pregnancy, second trimester: Secondary | ICD-10-CM

## 2015-09-04 LAB — POCT URINALYSIS DIP (DEVICE)
Bilirubin Urine: NEGATIVE
GLUCOSE, UA: NEGATIVE mg/dL
Hgb urine dipstick: NEGATIVE
KETONES UR: NEGATIVE mg/dL
Nitrite: NEGATIVE
PROTEIN: NEGATIVE mg/dL
Urobilinogen, UA: 0.2 mg/dL (ref 0.0–1.0)
pH: 6.5 (ref 5.0–8.0)

## 2015-09-04 LAB — OB RESULTS CONSOLE GC/CHLAMYDIA: GC PROBE AMP, GENITAL: NEGATIVE

## 2015-09-04 NOTE — Patient Instructions (Signed)
Second Trimester of Pregnancy The second trimester is from week 13 through week 28, months 4 through 6. The second trimester is often a time when you feel your best. Your body has also adjusted to being pregnant, and you begin to feel better physically. Usually, morning sickness has lessened or quit completely, you may have more energy, and you may have an increase in appetite. The second trimester is also a time when the fetus is growing rapidly. At the end of the sixth month, the fetus is about 9 inches long and weighs about 1 pounds. You will likely begin to feel the baby move (quickening) between 18 and 20 weeks of the pregnancy. BODY CHANGES Your body goes through many changes during pregnancy. The changes vary from woman to woman.   Your weight will continue to increase. You will notice your lower abdomen bulging out.  You may begin to get stretch marks on your hips, abdomen, and breasts.  You may develop headaches that can be relieved by medicines approved by your health care provider.  You may urinate more often because the fetus is pressing on your bladder.  You may develop or continue to have heartburn as a result of your pregnancy.  You may develop constipation because certain hormones are causing the muscles that push waste through your intestines to slow down.  You may develop hemorrhoids or swollen, bulging veins (varicose veins).  You may have back pain because of the weight gain and pregnancy hormones relaxing your joints between the bones in your pelvis and as a result of a shift in weight and the muscles that support your balance.  Your breasts will continue to grow and be tender.  Your gums may bleed and may be sensitive to brushing and flossing.  Dark spots or blotches (chloasma, mask of pregnancy) may develop on your face. This will likely fade after the baby is born.  A dark line from your belly button to the pubic area (linea nigra) may appear. This will likely  fade after the baby is born.  You may have changes in your hair. These can include thickening of your hair, rapid growth, and changes in texture. Some women also have hair loss during or after pregnancy, or hair that feels dry or thin. Your hair will most likely return to normal after your baby is born. WHAT TO EXPECT AT YOUR PRENATAL VISITS During a routine prenatal visit:  You will be weighed to make sure you and the fetus are growing normally.  Your blood pressure will be taken.  Your abdomen will be measured to track your baby's growth.  The fetal heartbeat will be listened to.  Any test results from the previous visit will be discussed. Your health care provider may ask you:  How you are feeling.  If you are feeling the baby move.  If you have had any abnormal symptoms, such as leaking fluid, bleeding, severe headaches, or abdominal cramping.  If you are using any tobacco products, including cigarettes, chewing tobacco, and electronic cigarettes.  If you have any questions. Other tests that may be performed during your second trimester include:  Blood tests that check for:  Low iron levels (anemia).  Gestational diabetes (between 24 and 28 weeks).  Rh antibodies.  Urine tests to check for infections, diabetes, or protein in the urine.  An ultrasound to confirm the proper growth and development of the baby.  An amniocentesis to check for possible genetic problems.  Fetal screens for spina bifida   and Down syndrome.  HIV (human immunodeficiency virus) testing. Routine prenatal testing includes screening for HIV, unless you choose not to have this test. HOME CARE INSTRUCTIONS   Avoid all smoking, herbs, alcohol, and unprescribed drugs. These chemicals affect the formation and growth of the baby.  Do not use any tobacco products, including cigarettes, chewing tobacco, and electronic cigarettes. If you need help quitting, ask your health care provider. You may receive  counseling support and other resources to help you quit.  Follow your health care provider's instructions regarding medicine use. There are medicines that are either safe or unsafe to take during pregnancy.  Exercise only as directed by your health care provider. Experiencing uterine cramps is a good sign to stop exercising.  Continue to eat regular, healthy meals.  Wear a good support bra for breast tenderness.  Do not use hot tubs, steam rooms, or saunas.  Wear your seat belt at all times when driving.  Avoid raw meat, uncooked cheese, cat litter boxes, and soil used by cats. These carry germs that can cause birth defects in the baby.  Take your prenatal vitamins.  Take 1500-2000 mg of calcium daily starting at the 20th week of pregnancy until you deliver your baby.  Try taking a stool softener (if your health care provider approves) if you develop constipation. Eat more high-fiber foods, such as fresh vegetables or fruit and whole grains. Drink plenty of fluids to keep your urine clear or pale yellow.  Take warm sitz baths to soothe any pain or discomfort caused by hemorrhoids. Use hemorrhoid cream if your health care provider approves.  If you develop varicose veins, wear support hose. Elevate your feet for 15 minutes, 3-4 times a day. Limit salt in your diet.  Avoid heavy lifting, wear low heel shoes, and practice good posture.  Rest with your legs elevated if you have leg cramps or low back pain.  Visit your dentist if you have not gone yet during your pregnancy. Use a soft toothbrush to brush your teeth and be gentle when you floss.  A sexual relationship may be continued unless your health care provider directs you otherwise.  Continue to go to all your prenatal visits as directed by your health care provider. SEEK MEDICAL CARE IF:   You have dizziness.  You have mild pelvic cramps, pelvic pressure, or nagging pain in the abdominal area.  You have persistent nausea,  vomiting, or diarrhea.  You have a bad smelling vaginal discharge.  You have pain with urination. SEEK IMMEDIATE MEDICAL CARE IF:   You have a fever.  You are leaking fluid from your vagina.  You have spotting or bleeding from your vagina.  You have severe abdominal cramping or pain.  You have rapid weight gain or loss.  You have shortness of breath with chest pain.  You notice sudden or extreme swelling of your face, hands, ankles, feet, or legs.  You have not felt your baby move in over an hour.  You have severe headaches that do not go away with medicine.  You have vision changes.   This information is not intended to replace advice given to you by your health care provider. Make sure you discuss any questions you have with your health care provider.   Document Released: 06/29/2001 Document Revised: 07/26/2014 Document Reviewed: 09/05/2012 Elsevier Interactive Patient Education 2016 [employer]  Contraception Choices Contraception (birth control) is the use of any methods or devices to prevent pregnancy. Below are some methods to   help avoid pregnancy. HORMONAL METHODS   Contraceptive implant. This is a thin, plastic tube containing progesterone hormone. It does not contain estrogen hormone. Your health care provider inserts the tube in the inner part of the upper arm. The tube can remain in place for up to 3 years. After 3 years, the implant must be removed. The implant prevents the ovaries from releasing an egg (ovulation), thickens the cervical mucus to prevent sperm from entering the uterus, and thins the lining of the inside of the uterus.  Progesterone-only injections. These injections are given every 3 months by your health care provider to prevent pregnancy. This synthetic progesterone hormone stops the ovaries from releasing eggs. It also thickens cervical mucus and changes the uterine lining. This makes it harder for sperm to survive in the uterus.  Birth  control pills. These pills contain estrogen and progesterone hormone. They work by preventing the ovaries from releasing eggs (ovulation). They also cause the cervical mucus to thicken, preventing the sperm from entering the uterus. Birth control pills are prescribed by a health care provider.Birth control pills can also be used to treat heavy periods.  Minipill. This type of birth control pill contains only the progesterone hormone. They are taken every day of each month and must be prescribed by your health care provider.  Birth control patch. The patch contains hormones similar to those in birth control pills. It must be changed once a week and is prescribed by a health care provider.  Vaginal ring. The ring contains hormones similar to those in birth control pills. It is left in the vagina for 3 weeks, removed for 1 week, and then a new one is put back in place. The patient must be comfortable inserting and removing the ring from the vagina.A health care provider's prescription is necessary.  Emergency contraception. Emergency contraceptives prevent pregnancy after unprotected sexual intercourse. This pill can be taken right after sex or up to 5 days after unprotected sex. It is most effective the sooner you take the pills after having sexual intercourse. Most emergency contraceptive pills are available without a prescription. Check with your pharmacist. Do not use emergency contraception as your only form of birth control. BARRIER METHODS   Female condom. This is a thin sheath (latex or rubber) that is worn over the penis during sexual intercourse. It can be used with spermicide to increase effectiveness.  Female condom. This is a soft, loose-fitting sheath that is put into the vagina before sexual intercourse.  Diaphragm. This is a soft, latex, dome-shaped barrier that must be fitted by a health care provider. It is inserted into the vagina, along with a spermicidal jelly. It is inserted before  intercourse. The diaphragm should be left in the vagina for 6 to 8 hours after intercourse.  Cervical cap. This is a round, soft, latex or plastic cup that fits over the cervix and must be fitted by a health care provider. The cap can be left in place for up to 48 hours after intercourse.  Sponge. This is a soft, circular piece of polyurethane foam. The sponge has spermicide in it. It is inserted into the vagina after wetting it and before sexual intercourse.  Spermicides. These are chemicals that kill or block sperm from entering the cervix and uterus. They come in the form of creams, jellies, suppositories, foam, or tablets. They do not require a prescription. They are inserted into the vagina with an applicator before having sexual intercourse. The process must be repeated every   time you have sexual intercourse. INTRAUTERINE CONTRACEPTION  Intrauterine device (IUD). This is a T-shaped device that is put in a woman's uterus during a menstrual period to prevent pregnancy. There are 2 types:  Copper IUD. This type of IUD is wrapped in copper wire and is placed inside the uterus. Copper makes the uterus and fallopian tubes produce a fluid that kills sperm. It can stay in place for 10 years.  Hormone IUD. This type of IUD contains the hormone progestin (synthetic progesterone). The hormone thickens the cervical mucus and prevents sperm from entering the uterus, and it also thins the uterine lining to prevent implantation of a fertilized egg. The hormone can weaken or kill the sperm that get into the uterus. It can stay in place for 3-5 years, depending on which type of IUD is used. PERMANENT METHODS OF CONTRACEPTION  Female tubal ligation. This is when the woman's fallopian tubes are surgically sealed, tied, or blocked to prevent the egg from traveling to the uterus.  Hysteroscopic sterilization. This involves placing a small coil or insert into each fallopian tube. Your doctor uses a technique  called hysteroscopy to do the procedure. The device causes scar tissue to form. This results in permanent blockage of the fallopian tubes, so the sperm cannot fertilize the egg. It takes about 3 months after the procedure for the tubes to become blocked. You must use another form of birth control for these 3 months.  Female sterilization. This is when the female has the tubes that carry sperm tied off (vasectomy).This blocks sperm from entering the vagina during sexual intercourse. After the procedure, the man can still ejaculate fluid (semen). NATURAL PLANNING METHODS  Natural family planning. This is not having sexual intercourse or using a barrier method (condom, diaphragm, cervical cap) on days the woman could become pregnant.  Calendar method. This is keeping track of the length of each menstrual cycle and identifying when you are fertile.  Ovulation method. This is avoiding sexual intercourse during ovulation.  Symptothermal method. This is avoiding sexual intercourse during ovulation, using a thermometer and ovulation symptoms.  Post-ovulation method. This is timing sexual intercourse after you have ovulated. Regardless of which type or method of contraception you choose, it is important that you use condoms to protect against the transmission of sexually transmitted infections (STIs). Talk with your health care provider about which form of contraception is most appropriate for you.   This information is not intended to replace advice given to you by your health care provider. Make sure you discuss any questions you have with your health care provider.   Document Released: 07/05/2005 Document Revised: 07/10/2013 Document Reviewed: 12/28/2012 Elsevier Interactive Patient Education 2016 [employer]  Breastfeeding Deciding to breastfeed is one of the best choices you can make for you and your baby. A change in hormones during pregnancy causes your breast tissue to grow and increases the  number and size of your milk ducts. These hormones also allow proteins, sugars, and fats from your blood supply to make breast milk in your milk-producing glands. Hormones prevent breast milk from being released before your baby is born as well as prompt milk flow after birth. Once breastfeeding has begun, thoughts of your baby, as well as his or her sucking or crying, can stimulate the release of milk from your milk-producing glands.  BENEFITS OF BREASTFEEDING For Your Baby  Your first milk (colostrum) helps your baby's digestive system function better.  There are antibodies in your milk that help   your baby fight off infections.  Your baby has a lower incidence of asthma, allergies, and sudden infant death syndrome.  The nutrients in breast milk are better for your baby than infant formulas and are designed uniquely for your baby's needs.  Breast milk improves your baby's brain development.  Your baby is less likely to develop other conditions, such as childhood obesity, asthma, or type 2 diabetes mellitus. For You  Breastfeeding helps to create a very special bond between you and your baby.  Breastfeeding is convenient. Breast milk is always available at the correct temperature and costs nothing.  Breastfeeding helps to burn calories and helps you lose the weight gained during pregnancy.  Breastfeeding makes your uterus contract to its prepregnancy size faster and slows bleeding (lochia) after you give birth.   Breastfeeding helps to lower your risk of developing type 2 diabetes mellitus, osteoporosis, and breast or ovarian cancer later in life. SIGNS THAT YOUR BABY IS HUNGRY Early Signs of Hunger  Increased alertness or activity.  Stretching.  Movement of the head from side to side.  Movement of the head and opening of the mouth when the corner of the mouth or cheek is stroked (rooting).  Increased sucking sounds, smacking lips, cooing, sighing, or squeaking.  Hand-to-mouth  movements.  Increased sucking of fingers or hands. Late Signs of Hunger  Fussing.  Intermittent crying. Extreme Signs of Hunger Signs of extreme hunger will require calming and consoling before your baby will be able to breastfeed successfully. Do not wait for the following signs of extreme hunger to occur before you initiate breastfeeding:  Restlessness.  A loud, strong cry.  Screaming. BREASTFEEDING BASICS Breastfeeding Initiation  Find a comfortable place to sit or lie down, with your neck and back well supported.  Place a pillow or rolled up blanket under your baby to bring him or her to the level of your breast (if you are seated). Nursing pillows are specially designed to help support your arms and your baby while you breastfeed.  Make sure that your baby's abdomen is facing your abdomen.  Gently massage your breast. With your fingertips, massage from your chest wall toward your nipple in a circular motion. This encourages milk flow. You may need to continue this action during the feeding if your milk flows slowly.  Support your breast with 4 fingers underneath and your thumb above your nipple. Make sure your fingers are well away from your nipple and your baby's mouth.  Stroke your baby's lips gently with your finger or nipple.  When your baby's mouth is open wide enough, quickly bring your baby to your breast, placing your entire nipple and as much of the colored area around your nipple (areola) as possible into your baby's mouth.  More areola should be visible above your baby's upper lip than below the lower lip.  Your baby's tongue should be between his or her lower gum and your breast.  Ensure that your baby's mouth is correctly positioned around your nipple (latched). Your baby's lips should create a seal on your breast and be turned out (everted).  It is common for your baby to suck about 2-3 minutes in order to start the flow of breast milk. Latching Teaching  your baby how to latch on to your breast properly is very important. An improper latch can cause nipple pain and decreased milk supply for you and poor weight gain in your baby. Also, if your baby is not latched onto your nipple properly, he   or she may swallow some air during feeding. This can make your baby fussy. Burping your baby when you switch breasts during the feeding can help to get rid of the air. However, teaching your baby to latch on properly is still the best way to prevent fussiness from swallowing air while breastfeeding. Signs that your baby has successfully latched on to your nipple:  Silent tugging or silent sucking, without causing you pain.  Swallowing heard between every 3-4 sucks.  Muscle movement above and in front of his or her ears while sucking. Signs that your baby has not successfully latched on to nipple:  Sucking sounds or smacking sounds from your baby while breastfeeding.  Nipple pain. If you think your baby has not latched on correctly, slip your finger into the corner of your baby's mouth to break the suction and place it between your baby's gums. Attempt breastfeeding initiation again. Signs of Successful Breastfeeding Signs from your baby:  A gradual decrease in the number of sucks or complete cessation of sucking.  Falling asleep.  Relaxation of his or her body.  Retention of a small amount of milk in his or her mouth.  Letting go of your breast by himself or herself. Signs from you:  Breasts that have increased in firmness, weight, and size 1-3 hours after feeding.  Breasts that are softer immediately after breastfeeding.  Increased milk volume, as well as a change in milk consistency and color by the fifth day of breastfeeding.  Nipples that are not sore, cracked, or bleeding. Signs That Your Baby is Getting Enough Milk  Wetting at least 3 diapers in a 24-hour period. The urine should be clear and pale yellow by age 5 days.  At least 3  stools in a 24-hour period by age 5 days. The stool should be soft and yellow.  At least 3 stools in a 24-hour period by age 7 days. The stool should be seedy and yellow.  No loss of weight greater than 10% of birth weight during the first 3 days of age.  Average weight gain of 4-7 ounces (113-198 g) per week after age 4 days.  Consistent daily weight gain by age 5 days, without weight loss after the age of 2 weeks. After a feeding, your baby may spit up a small amount. This is common. BREASTFEEDING FREQUENCY AND DURATION Frequent feeding will help you make more milk and can prevent sore nipples and breast engorgement. Breastfeed when you feel the need to reduce the fullness of your breasts or when your baby shows signs of hunger. This is called "breastfeeding on demand." Avoid introducing a pacifier to your baby while you are working to establish breastfeeding (the first 4-6 weeks after your baby is born). After this time you may choose to use a pacifier. Research has shown that pacifier use during the first year of a baby's life decreases the risk of sudden infant death syndrome (SIDS). Allow your baby to feed on each breast as long as he or she wants. Breastfeed until your baby is finished feeding. When your baby unlatches or falls asleep while feeding from the first breast, offer the second breast. Because newborns are often sleepy in the first few weeks of life, you may need to awaken your baby to get him or her to feed. Breastfeeding times will vary from baby to baby. However, the following rules can serve as a guide to help you ensure that your baby is properly fed:  Newborns (babies 4 weeks   of age or younger) may breastfeed every 1-3 hours.  Newborns should not go longer than 3 hours during the day or 5 hours during the night without breastfeeding.  You should breastfeed your baby a minimum of 8 times in a 24-hour period until you begin to introduce solid foods to your baby at around 6  months of age. BREAST MILK PUMPING Pumping and storing breast milk allows you to ensure that your baby is exclusively fed your breast milk, even at times when you are unable to breastfeed. This is especially important if you are going back to work while you are still breastfeeding or when you are not able to be present during feedings. Your lactation consultant can give you guidelines on how long it is safe to store breast milk. A breast pump is a machine that allows you to pump milk from your breast into a sterile bottle. The pumped breast milk can then be stored in a refrigerator or freezer. Some breast pumps are operated by hand, while others use electricity. Ask your lactation consultant which type will work best for you. Breast pumps can be purchased, but some hospitals and breastfeeding support groups lease breast pumps on a monthly basis. A lactation consultant can teach you how to hand express breast milk, if you prefer not to use a pump. CARING FOR YOUR BREASTS WHILE YOU BREASTFEED Nipples can become dry, cracked, and sore while breastfeeding. The following recommendations can help keep your breasts moisturized and healthy:  Avoid using soap on your nipples.  Wear a supportive bra. Although not required, special nursing bras and tank tops are designed to allow access to your breasts for breastfeeding without taking off your entire bra or top. Avoid wearing underwire-style bras or extremely tight bras.  Air dry your nipples for 3-4minutes after each feeding.  Use only cotton bra pads to absorb leaked breast milk. Leaking of breast milk between feedings is normal.  Use lanolin on your nipples after breastfeeding. Lanolin helps to maintain your skin's normal moisture barrier. If you use pure lanolin, you do not need to wash it off before feeding your baby again. Pure lanolin is not toxic to your baby. You may also hand express a few drops of breast milk and gently massage that milk into your  nipples and allow the milk to air dry. In the first few weeks after giving birth, some women experience extremely full breasts (engorgement). Engorgement can make your breasts feel heavy, warm, and tender to the touch. Engorgement peaks within 3-5 days after you give birth. The following recommendations can help ease engorgement:  Completely empty your breasts while breastfeeding or pumping. You may want to start by applying warm, moist heat (in the shower or with warm water-soaked hand towels) just before feeding or pumping. This increases circulation and helps the milk flow. If your baby does not completely empty your breasts while breastfeeding, pump any extra milk after he or she is finished.  Wear a snug bra (nursing or regular) or tank top for 1-2 days to signal your body to slightly decrease milk production.  Apply ice packs to your breasts, unless this is too uncomfortable for you.  Make sure that your baby is latched on and positioned properly while breastfeeding. If engorgement persists after 48 hours of following these recommendations, contact your health care provider or a lactation consultant. OVERALL HEALTH CARE RECOMMENDATIONS WHILE BREASTFEEDING  Eat healthy foods. Alternate between meals and snacks, eating 3 of each per day. Because what   you eat affects your breast milk, some of the foods may make your baby more irritable than usual. Avoid eating these foods if you are sure that they are negatively affecting your baby.  Drink milk, fruit juice, and water to satisfy your thirst (about 10 glasses a day).  Rest often, relax, and continue to take your prenatal vitamins to prevent fatigue, stress, and anemia.  Continue breast self-awareness checks.  Avoid chewing and smoking tobacco. Chemicals from cigarettes that pass into breast milk and exposure to secondhand smoke may harm your baby.  Avoid alcohol and drug use, including marijuana. Some medicines that may be harmful to your  baby can pass through breast milk. It is important to ask your health care provider before taking any medicine, including all over-the-counter and prescription medicine as well as vitamin and herbal supplements. It is possible to become pregnant while breastfeeding. If birth control is desired, ask your health care provider about options that will be safe for your baby. SEEK MEDICAL CARE IF:  You feel like you want to stop breastfeeding or have become frustrated with breastfeeding.  You have painful breasts or nipples.  Your nipples are cracked or bleeding.  Your breasts are red, tender, or warm.  You have a swollen area on either breast.  You have a fever or chills.  You have nausea or vomiting.  You have drainage other than breast milk from your nipples.  Your breasts do not become full before feedings by the fifth day after you give birth.  You feel sad and depressed.  Your baby is too sleepy to eat well.  Your baby is having trouble sleeping.   Your baby is wetting less than 3 diapers in a 24-hour period.  Your baby has less than 3 stools in a 24-hour period.  Your baby's skin or the white part of his or her eyes becomes yellow.   Your baby is not gaining weight by 5 days of age. SEEK IMMEDIATE MEDICAL CARE IF:  Your baby is overly tired (lethargic) and does not want to wake up and feed.  Your baby develops an unexplained fever.   This information is not intended to replace advice given to you by your health care provider. Make sure you discuss any questions you have with your health care provider.   Document Released: 07/05/2005 Document Revised: 03/26/2015 Document Reviewed: 12/27/2012 Elsevier Interactive Patient Education 2016 Elsevier Inc.  

## 2015-09-04 NOTE — Progress Notes (Signed)
U/S scheduled for 10/02/2015 :00PM

## 2015-09-04 NOTE — Progress Notes (Signed)
   Subjective:    Michelle Shah is a G1P0 [redacted]w[redacted]d being seen today for her first obstetrical visit.  Her obstetrical history is significant for obesity. Patient does intend to breast feed. Pregnancy history fully reviewed.  Patient reports no complaints.  Filed Vitals:   09/04/15 0915  BP: 113/64  Pulse: 94  Temp: 98.7 F (37.1 C)  Weight: 195 lb 6.4 oz (88.633 kg)    HISTORY: OB History  Gravida Para Term Preterm AB SAB TAB Ectopic Multiple Living  1             # Outcome Date GA Lbr Len/2nd Weight Sex Delivery Anes PTL Lv  1 Current              Past Medical History  Diagnosis Date  . Seasonal allergies   . Anxiety   . Anemia   . Depression    Past Surgical History  Procedure Laterality Date  . No past surgeries     Family History  Problem Relation Age of Onset  . Diabetes Father   . Diabetes Paternal Aunt   . Diabetes Paternal Uncle      Exam    Uterus:     Pelvic Exam:    Perineum: Normal Perineum   Vulva: normal   Vagina:  normal mucosa, normal discharge   pH:    Cervix: nulliparous appearance and closed and long   Adnexa: no mass, fullness, tenderness   Bony Pelvis: gynecoid  System: Breast:  normal appearance, no masses or tenderness   Skin: normal coloration and turgor, no rashes    Neurologic: oriented, no focal deficits   Extremities: normal strength, tone, and muscle mass   HEENT extra ocular movement intact   Mouth/Teeth mucous membranes moist, pharynx normal without lesions and dental hygiene good   Neck supple and no masses   Cardiovascular: regular rate and rhythm   Respiratory:  chest clear, no wheezing, crepitations, rhonchi, normal symmetric air entry   Abdomen: soft, non-tender; bowel sounds normal; no masses,  no organomegaly   Urinary:       Assessment:    Pregnancy: G1P0 Patient Active Problem List   Diagnosis Date Noted  . Supervision of normal first pregnancy, antepartum 09/04/2015        Plan:     Initial  labs drawn. Prenatal vitamins. Problem list reviewed and updated. Genetic Screening discussed Quad Screen: requested.  Ultrasound discussed; fetal survey: ordered. Early glucola due to obesity performed  Follow up in 4 weeks. 50% of 30 min visit spent on counseling and coordination of care.     Michelle Shah 09/04/2015

## 2015-09-04 NOTE — Progress Notes (Signed)
Pt declined flu vaccine at this time.

## 2015-09-05 LAB — GC/CHLAMYDIA PROBE AMP (~~LOC~~) NOT AT ARMC
CHLAMYDIA, DNA PROBE: NEGATIVE
NEISSERIA GONORRHEA: NEGATIVE

## 2015-09-05 LAB — PRENATAL PROFILE (SOLSTAS)
Antibody Screen: NEGATIVE
Basophils Absolute: 0 10*3/uL (ref 0.0–0.1)
Basophils Relative: 0 % (ref 0–1)
EOS ABS: 0.1 10*3/uL (ref 0.0–0.7)
EOS PCT: 1 % (ref 0–5)
HEMATOCRIT: 31.2 % — AB (ref 36.0–46.0)
HEMOGLOBIN: 10.2 g/dL — AB (ref 12.0–15.0)
HEP B S AG: NEGATIVE
HIV 1&2 Ab, 4th Generation: NONREACTIVE
LYMPHS ABS: 2 10*3/uL (ref 0.7–4.0)
Lymphocytes Relative: 19 % (ref 12–46)
MCH: 27.6 pg (ref 26.0–34.0)
MCHC: 32.7 g/dL (ref 30.0–36.0)
MCV: 84.6 fL (ref 78.0–100.0)
MONOS PCT: 5 % (ref 3–12)
MPV: 9.1 fL (ref 8.6–12.4)
Monocytes Absolute: 0.5 10*3/uL (ref 0.1–1.0)
Neutro Abs: 7.9 10*3/uL — ABNORMAL HIGH (ref 1.7–7.7)
Neutrophils Relative %: 75 % (ref 43–77)
Platelets: 286 10*3/uL (ref 150–400)
RBC: 3.69 MIL/uL — ABNORMAL LOW (ref 3.87–5.11)
RDW: 14 % (ref 11.5–15.5)
Rh Type: POSITIVE
Rubella: 1.8 Index — ABNORMAL HIGH (ref <0.90)
WBC: 10.5 10*3/uL (ref 4.0–10.5)

## 2015-09-05 LAB — CULTURE, OB URINE
COLONY COUNT: NO GROWTH
ORGANISM ID, BACTERIA: NO GROWTH

## 2015-09-05 LAB — GLUCOSE TOLERANCE, 1 HOUR (50G) W/O FASTING: Glucose, 1 Hour GTT: 93 mg/dL (ref 70–140)

## 2015-09-08 LAB — CANNABANOIDS (GC/LC/MS), URINE: THC-COOH UR CONFIRM: 446 ng/mL — AB (ref <5)

## 2015-09-08 LAB — CYTOLOGY - PAP

## 2015-09-09 LAB — PRESCRIPTION MONITORING PROFILE (19 PANEL)
AMPHETAMINE/METH: NEGATIVE ng/mL
BENZODIAZEPINE SCREEN, URINE: NEGATIVE ng/mL
BUPRENORPHINE, URINE: NEGATIVE ng/mL
Barbiturate Screen, Urine: NEGATIVE ng/mL
Carisoprodol, Urine: NEGATIVE ng/mL
Cocaine Metabolites: NEGATIVE ng/mL
Creatinine, Urine: 230.71 mg/dL (ref >20.0)
ECSTASY: NEGATIVE ng/mL
FENTANYL URINE: NEGATIVE ng/mL
MEPERIDINE UR: NEGATIVE ng/mL
METHADONE SCREEN, URINE: NEGATIVE ng/mL
METHAQUALONE SCREEN (URINE): NEGATIVE ng/mL
Nitrites, Initial: NEGATIVE ug/mL
Opiate Screen, Urine: NEGATIVE ng/mL
Oxycodone Screen, Ur: NEGATIVE ng/mL
PHENCYCLIDINE, UR: NEGATIVE ng/mL
Propoxyphene: NEGATIVE ng/mL
Tapentadol, urine: NEGATIVE ng/mL
Tramadol Scrn, Ur: NEGATIVE ng/mL
ZOLPIDEM, URINE: NEGATIVE ng/mL
pH, Initial: 6.7 pH (ref 4.5–8.9)

## 2015-09-10 ENCOUNTER — Other Ambulatory Visit: Payer: Self-pay | Admitting: *Deleted

## 2015-09-10 ENCOUNTER — Other Ambulatory Visit: Payer: Self-pay | Admitting: Advanced Practice Midwife

## 2015-09-10 DIAGNOSIS — M62838 Other muscle spasm: Secondary | ICD-10-CM

## 2015-09-10 MED ORDER — CYCLOBENZAPRINE HCL 10 MG PO TABS: 10.0000 mg | ORAL_TABLET | Freq: Three times a day (TID) | ORAL | Status: DC | PRN

## 2015-09-11 ENCOUNTER — Encounter: Payer: Self-pay | Admitting: Family

## 2015-09-12 ENCOUNTER — Encounter: Payer: Self-pay | Admitting: Obstetrics and Gynecology

## 2015-10-02 ENCOUNTER — Other Ambulatory Visit: Payer: Self-pay | Admitting: Obstetrics and Gynecology

## 2015-10-02 ENCOUNTER — Ambulatory Visit (INDEPENDENT_AMBULATORY_CARE_PROVIDER_SITE_OTHER): Payer: Self-pay | Admitting: Family

## 2015-10-02 ENCOUNTER — Ambulatory Visit (HOSPITAL_COMMUNITY)
Admission: RE | Admit: 2015-10-02 | Discharge: 2015-10-02 | Disposition: A | Discharge status: Home / Self Care | Payer: Self-pay | Source: Ambulatory Visit | Attending: Obstetrics and Gynecology | Admitting: Obstetrics and Gynecology

## 2015-10-02 VITALS — BP 118/40 | HR 75 | Temp 98.0°F | Wt 201.3 lb

## 2015-10-02 DIAGNOSIS — Z3689 Encounter for other specified antenatal screening: Secondary | ICD-10-CM

## 2015-10-02 DIAGNOSIS — Z3402 Encounter for supervision of normal first pregnancy, second trimester: Secondary | ICD-10-CM

## 2015-10-02 DIAGNOSIS — Z36 Encounter for antenatal screening of mother: Secondary | ICD-10-CM | POA: Insufficient documentation

## 2015-10-02 DIAGNOSIS — Z3A18 18 weeks gestation of pregnancy: Secondary | ICD-10-CM

## 2015-10-02 DIAGNOSIS — Z3A20 20 weeks gestation of pregnancy: Secondary | ICD-10-CM | POA: Insufficient documentation

## 2015-10-02 LAB — POCT URINALYSIS DIP (DEVICE)
Bilirubin Urine: NEGATIVE
GLUCOSE, UA: NEGATIVE mg/dL
Ketones, ur: NEGATIVE mg/dL
NITRITE: NEGATIVE
PH: 7 (ref 5.0–8.0)
PROTEIN: NEGATIVE mg/dL
Specific Gravity, Urine: 1.02 (ref 1.005–1.030)
UROBILINOGEN UA: 0.2 mg/dL (ref 0.0–1.0)

## 2015-10-02 NOTE — Progress Notes (Signed)
Subjective:  Michelle Shah is a 25 y.o. G1P0 at 4064w4d being seen today for ongoing prenatal care.  She is currently monitored for the following issues for this low-risk pregnancy and has Supervision of normal first pregnancy, antepartum on her problem list.  Patient reports no complaints.   Movement: Present. Denies leaking of fluid.   The following portions of the patient's history were reviewed and updated as appropriate: allergies, current medications, past family history, past medical history, past social history, past surgical history and problem list. Problem list updated.  Objective:   Filed Vitals:   10/02/15 1552  BP: 118/40  Pulse: 75  Temp: 98 F (36.7 C)  Weight: 201 lb 4.8 oz (91.309 kg)    Fetal Status: Fetal Heart Rate (bpm): 154 Fundal Height: 20 cm Movement: Present     General:  Alert, oriented and cooperative. Patient is in no acute distress.  Skin: Skin is warm and dry. No rash noted.   Cardiovascular: Normal heart rate noted  Respiratory: Normal respiratory effort, no problems with respiration noted  Abdomen: Soft, gravid, appropriate for gestational age. Pain/Pressure: Absent     Pelvic:       Cervical exam deferred        Extremities: Normal range of motion.     Mental Status: Normal mood and affect. Normal behavior. Normal judgment and thought content.   Urinalysis: Urine Protein: Negative Urine Glucose: Negative  Assessment and Plan:  Pregnancy: G1P0 at 6364w4d  1. Supervision of normal first pregnancy, antepartum, second trimester - AFP, Quad Screen - Ultrasound completed today - not read or in Epic  General obstetric precautions including but not limited to vaginal bleeding and pelvic pain reviewed in detail with the patient. Please refer to After Visit Summary for other counseling recommendations.  Return in about 4 weeks (around 10/30/2015).   Eino FarberWalidah Kennith GainN Karim, CNM

## 2015-10-07 ENCOUNTER — Telehealth (HOSPITAL_COMMUNITY): Payer: Self-pay | Admitting: Obstetrics and Gynecology

## 2015-10-07 ENCOUNTER — Inpatient Hospital Stay (HOSPITAL_COMMUNITY)
Admission: AD | Admit: 2015-10-07 | Discharge: 2015-10-07 | Disposition: A | Discharge status: Home / Self Care | Payer: Self-pay | Source: Ambulatory Visit | Attending: Obstetrics & Gynecology | Admitting: Obstetrics & Gynecology

## 2015-10-07 ENCOUNTER — Encounter (HOSPITAL_COMMUNITY): Payer: Self-pay | Admitting: *Deleted

## 2015-10-07 DIAGNOSIS — J111 Influenza due to unidentified influenza virus with other respiratory manifestations: Secondary | ICD-10-CM | POA: Insufficient documentation

## 2015-10-07 DIAGNOSIS — O219 Vomiting of pregnancy, unspecified: Secondary | ICD-10-CM | POA: Insufficient documentation

## 2015-10-07 DIAGNOSIS — Z87891 Personal history of nicotine dependence: Secondary | ICD-10-CM | POA: Insufficient documentation

## 2015-10-07 DIAGNOSIS — Z3A19 19 weeks gestation of pregnancy: Secondary | ICD-10-CM | POA: Insufficient documentation

## 2015-10-07 DIAGNOSIS — O98512 Other viral diseases complicating pregnancy, second trimester: Secondary | ICD-10-CM | POA: Insufficient documentation

## 2015-10-07 DIAGNOSIS — Z833 Family history of diabetes mellitus: Secondary | ICD-10-CM | POA: Insufficient documentation

## 2015-10-07 DIAGNOSIS — R6889 Other general symptoms and signs: Secondary | ICD-10-CM

## 2015-10-07 LAB — URINALYSIS, ROUTINE W REFLEX MICROSCOPIC
BILIRUBIN URINE: NEGATIVE
Glucose, UA: NEGATIVE mg/dL
HGB URINE DIPSTICK: NEGATIVE
Ketones, ur: NEGATIVE mg/dL
NITRITE: NEGATIVE
PH: 7.5 (ref 5.0–8.0)
Protein, ur: NEGATIVE mg/dL
SPECIFIC GRAVITY, URINE: 1.02 (ref 1.005–1.030)

## 2015-10-07 LAB — AFP, QUAD SCREEN
AFP: 59.5 ng/mL
Curr Gest Age: 18.4 wks.days
HCG, Total: 5.51 IU/mL
INH: 73.1 pg/mL
INTERPRETATION-AFP: NEGATIVE
MOM FOR AFP: 1.35
MOM FOR HCG: 0.25
MoM for INH: 0.49
OPEN SPINA BIFIDA: NEGATIVE
TRI 18 SCR RISK EST: NEGATIVE
UE3 VALUE: 1.1 ng/mL
uE3 Mom: 0.82

## 2015-10-07 LAB — INFLUENZA PANEL BY PCR (TYPE A & B)
H1N1 flu by pcr: NOT DETECTED
INFLAPCR: POSITIVE — AB
INFLBPCR: NEGATIVE

## 2015-10-07 LAB — URINE MICROSCOPIC-ADD ON: RBC / HPF: NONE SEEN RBC/hpf (ref 0–5)

## 2015-10-07 MED ORDER — LORATADINE 10 MG PO TABS
10.0000 mg | ORAL_TABLET | Freq: Every day | ORAL | Status: DC
  Administered 2015-10-07: 10 mg via ORAL
  Filled 2015-10-07: qty 1

## 2015-10-07 MED ORDER — ONDANSETRON 4 MG PO TBDP
4.0000 mg | ORAL_TABLET | Freq: Once | ORAL | Status: AC
  Administered 2015-10-07: 4 mg via ORAL
  Filled 2015-10-07: qty 1

## 2015-10-07 MED ORDER — OSELTAMIVIR PHOSPHATE 75 MG PO CAPS: 75.0000 mg | ORAL_CAPSULE | Freq: Two times a day (BID) | ORAL | Status: DC

## 2015-10-07 MED ORDER — ONDANSETRON 4 MG PO TBDP: 4.0000 mg | ORAL_TABLET | Freq: Three times a day (TID) | ORAL | Status: DC | PRN

## 2015-10-07 MED ORDER — ACETAMINOPHEN 500 MG PO TABS
1000.0000 mg | ORAL_TABLET | Freq: Once | ORAL | Status: AC
  Administered 2015-10-07: 1000 mg via ORAL
  Filled 2015-10-07: qty 2

## 2015-10-07 NOTE — Discharge Instructions (Signed)
Upper Respiratory Infection, Adult Most upper respiratory infections (URIs) are a viral infection of the air passages leading to the lungs. A URI affects the nose, throat, and upper air passages. The most common type of URI is nasopharyngitis and is typically referred to as "the common cold." URIs run their course and usually go away on their own. Most of the time, a URI does not require medical attention, but sometimes a bacterial infection in the upper airways can follow a viral infection. This is called a secondary infection. Sinus and middle ear infections are common types of secondary upper respiratory infections. Bacterial pneumonia can also complicate a URI. A URI can worsen asthma and chronic obstructive pulmonary disease (COPD). Sometimes, these complications can require emergency medical care and may be life threatening.  CAUSES Almost all URIs are caused by viruses. A virus is a type of germ and can spread from one person to another.  RISKS FACTORS You may be at risk for a URI if:   You smoke.   You have chronic heart or lung disease.  You have a weakened defense (immune) system.   You are very young or very old.   You have nasal allergies or asthma.  You work in crowded or poorly ventilated areas.  You work in health care facilities or schools. SIGNS AND SYMPTOMS  Symptoms typically develop 2-3 days after you come in contact with a cold virus. Most viral URIs last 7-10 days. However, viral URIs from the influenza virus (flu virus) can last 14-18 days and are typically more severe. Symptoms may include:   Runny or stuffy (congested) nose.   Sneezing.   Cough.   Sore throat.   Headache.   Fatigue.   Fever.   Loss of appetite.   Pain in your forehead, behind your eyes, and over your cheekbones (sinus pain).  Muscle aches.  DIAGNOSIS  Your health care provider may diagnose a URI by:  Physical exam.  Tests to check that your symptoms are not due to  another condition such as:  Strep throat.  Sinusitis.  Pneumonia.  Asthma. TREATMENT  A URI goes away on its own with time. It cannot be cured with medicines, but medicines may be prescribed or recommended to relieve symptoms. Medicines may help:  Reduce your fever.  Reduce your cough.  Relieve nasal congestion. HOME CARE INSTRUCTIONS   Take medicines only as directed by your health care provider.   Gargle warm saltwater or take cough drops to comfort your throat as directed by your health care provider.  Use a warm mist humidifier or inhale steam from a shower to increase air moisture. This may make it easier to breathe.  Drink enough fluid to keep your urine clear or pale yellow.   Eat soups and other clear broths and maintain good nutrition.   Rest as needed.   Return to work when your temperature has returned to normal or as your health care provider advises. You may need to stay home longer to avoid infecting others. You can also use a face mask and careful hand washing to prevent spread of the virus.  Increase the usage of your inhaler if you have asthma.   Do not use any tobacco products, including cigarettes, chewing tobacco, or electronic cigarettes. If you need help quitting, ask your health care provider. PREVENTION  The best way to protect yourself from getting a cold is to practice good hygiene.   Avoid oral or hand contact with people with cold   symptoms.   Wash your hands often if contact occurs.  There is no clear evidence that vitamin C, vitamin E, echinacea, or exercise reduces the chance of developing a cold. However, it is always recommended to get plenty of rest, exercise, and practice good nutrition.  SEEK MEDICAL CARE IF:   You are getting worse rather than better.   Your symptoms are not controlled by medicine.   You have chills.  You have worsening shortness of breath.  You have brown or red mucus.  You have yellow or brown nasal  discharge.  You have pain in your face, especially when you bend forward.  You have a fever.  You have swollen neck glands.  You have pain while swallowing.  You have white areas in the back of your throat. SEEK IMMEDIATE MEDICAL CARE IF:   You have severe or persistent:  Headache.  Ear pain.  Sinus pain.  Chest pain.  You have chronic lung disease and any of the following:  Wheezing.  Prolonged cough.  Coughing up blood.  A change in your usual mucus.  You have a stiff neck.  You have changes in your:  Vision.  Hearing.  Thinking.  Mood. MAKE SURE YOU:   Understand these instructions.  Will watch your condition.  Will get help right away if you are not doing well or get worse.   This information is not intended to replace advice given to you by your health care provider. Make sure you discuss any questions you have with your health care provider.   Document Released: 12/29/2000 Document Revised: 11/19/2014 Document Reviewed: 10/10/2013 Elsevier Interactive Patient Education 2016 Elsevier Inc.  

## 2015-10-07 NOTE — MAU Note (Signed)
Pt C/O cold sx's for 2 days, has been vomiting more than usual, has had at least 4 diarrhea stools this morning.  Thinks she has had a fever, hasn't taken temp.  Sore throat & cough.

## 2015-10-07 NOTE — Telephone Encounter (Signed)
+   influenza A. Tamiflu called in. Left message.

## 2015-10-07 NOTE — MAU Provider Note (Signed)
History     CSN: [id number]  Arrival date and time: 10/07/15 9562   First Provider Initiated Contact with Patient 10/07/15 1040      Chief Complaint  Patient presents with  . URI  . Emesis During Pregnancy  . Diarrhea   HPI   [name] a 25 y.o. female G1P0 at [redacted]w[redacted]d presenting to MAU with multiple complaints. 2 days ago she started experiencing a runny nose, itchy throat, and sore throat with cough.  She normally throws up once in the morning, however this morning she has vomited twice. She is not taking anything for the nausea. " I feel terrible and achy".   No one else in her household is sick.  OB History    Gravida Para Term Preterm AB TAB SAB Ectopic Multiple Living   1               Past Medical History  Diagnosis Date  . Seasonal allergies   . Anxiety   . Anemia   . Depression     Past Surgical History  Procedure Laterality Date  . No past surgeries      Family History  Problem Relation Age of Onset  . Diabetes Father   . Diabetes Paternal Aunt   . Diabetes Paternal Uncle     Social History  Substance Use Topics  . Smoking status: Former Games developer  . Smokeless tobacco: Never Used  . Alcohol Use: No     Comment: weekly    Allergies: No Known Allergies  Prescriptions prior to admission  Medication Sig Dispense Refill Last Dose  . Prenatal Vit-Min-FA-Fish Oil ([facility] PRENATAL GUMMY PO) Take 2 each by mouth daily.   10/07/2015 at Unknown time  . cyclobenzaprine (FLEXERIL) 10 MG tablet Take 1 tablet (10 mg total) by mouth 3 (three) times daily as needed for muscle spasms. (Patient not taking: Reported on 10/07/2015) 30 tablet 0 Taking  . metoCLOPramide (REGLAN) 10 MG tablet Take 1 tablet (10 mg total) by mouth every 6 (six) hours as needed for nausea (nausea/headache). (Patient not taking: Reported on 10/07/2015) 20 tablet 0 Taking  . traMADol (ULTRAM) 50 MG tablet Take 1 tablet (50 mg total) by mouth every 6 (six) hours as needed. (Patient not  taking: Reported on 10/02/2015) 15 tablet 0 Not Taking   Results for orders placed or performed during the hospital encounter of 10/07/15 (from the past 48 hour(s))  Urinalysis, Routine w reflex microscopic (not at [facility])     Status: Abnormal   Collection Time: 10/07/15 10:03 AM  Result Value Ref Range   Color, Urine YELLOW YELLOW   APPearance CLEAR CLEAR   Specific Gravity, Urine 1.020 1.005 - 1.030   pH 7.5 5.0 - 8.0   Glucose, UA NEGATIVE NEGATIVE mg/dL   Hgb urine dipstick NEGATIVE NEGATIVE   Bilirubin Urine NEGATIVE NEGATIVE   Ketones, ur NEGATIVE NEGATIVE mg/dL   Protein, ur NEGATIVE NEGATIVE mg/dL   Nitrite NEGATIVE NEGATIVE   Leukocytes, UA SMALL (A) NEGATIVE  Urine microscopic-add on     Status: Abnormal   Collection Time: 10/07/15 10:03 AM  Result Value Ref Range   Squamous Epithelial / LPF 0-5 (A) NONE SEEN   WBC, UA 0-5 0 - 5 WBC/hpf   RBC / HPF NONE SEEN 0 - 5 RBC/hpf   Bacteria, UA FEW (A) NONE SEEN  Influenza panel by PCR (type A & B, H1N1)     Status: Abnormal   Collection Time: 10/07/15 11:27 AM  Result Value Ref Range   Influenza A By PCR POSITIVE (A) NEGATIVE   Influenza B By PCR NEGATIVE NEGATIVE   H1N1 flu by pcr NOT DETECTED NOT DETECTED    Comment:        The Xpert Flu assay (FDA approved for nasal aspirates or washes and nasopharyngeal swab specimens), is intended as an aid in the diagnosis of influenza and should not be used as a sole basis for treatment. Performed [facility]     Review of Systems  Constitutional: Positive for chills. Negative for fever.  Gastrointestinal: Positive for nausea, vomiting and diarrhea (4 episodes of diarrhea today. ).   Physical Exam   Blood pressure 120/59, pulse 101, temperature 98.5 F (36.9 C), temperature source Oral, resp. rate 18, last menstrual period 05/25/2015, SpO2 100 %.  Physical Exam  Constitutional: She is oriented to person, place, and time. Vital signs are normal. She appears  well-developed and well-nourished.  Non-toxic appearance. She has a sickly appearance. She does not appear ill. No distress.  HENT:  Mouth/Throat: Uvula is midline. Posterior oropharyngeal erythema present. No oropharyngeal exudate, posterior oropharyngeal edema or tonsillar abscesses.  Bilateral turbinates with erythema.   Neck: Neck supple.  Cardiovascular: Normal rate and normal heart sounds.   Respiratory: Effort normal and breath sounds normal. No respiratory distress. She has no wheezes. She has no rales. She exhibits no tenderness.  Musculoskeletal: Normal range of motion.  Neurological: She is alert and oriented to person, place, and time.  Skin: She is not diaphoretic.  Psychiatric: Her behavior is normal.    MAU Course  Procedures  None  MDM  Influenza swab Tylenol 1 gram Zofran 4 mg Claritin  Patient feeling much better at the time of discharge.   Assessment and Plan   A:  1. Nausea and vomiting during pregnancy   2. Flu-like symptoms    P:  Discharge home in stable condition I will call the patient with Influenza results A list of safe medications given to the patient to take during pregnancy Stay well hydrated.  RX: Zofran  Ok to take tylenol as directed on the bottle.    Duane LopeJennifer I Danna Sewell, NP 10/07/2015 10:59 AM   Patient called at 1740 and notified of positive influenza A ; tamiflu called into her pharmacy.  Patient requests a work noted to be faxed to work job with attention to [employer] @ [phone]. Patient to return to work if better and without fever on 3/24 Patient to seek reevaluation if symptoms worsen  Duane LopeJennifer I Osmany Azer, NP 10/07/2015 7:42 PM

## 2015-10-30 ENCOUNTER — Ambulatory Visit (INDEPENDENT_AMBULATORY_CARE_PROVIDER_SITE_OTHER): Payer: Self-pay | Admitting: Advanced Practice Midwife

## 2015-10-30 VITALS — BP 121/51 | HR 83 | Temp 98.0°F | Wt 204.3 lb

## 2015-10-30 DIAGNOSIS — Z3402 Encounter for supervision of normal first pregnancy, second trimester: Secondary | ICD-10-CM

## 2015-10-30 NOTE — Progress Notes (Signed)
Subjective:  Michelle Shah is a 25 y.o. G1P0 at 1078w4d being seen today for ongoing prenatal care.  She is currently monitored for the following issues for this low-risk pregnancy and has Supervision of normal first pregnancy, antepartum on her problem list.  Patient reports no complaints.  Contractions: Not present. Vag. Bleeding: None.  Movement: Present. Denies leaking of fluid.   The following portions of the patient's history were reviewed and updated as appropriate: allergies, current medications, past family history, past medical history, past social history, past surgical history and problem list. Problem list updated.  Objective:   Filed Vitals:   10/30/15 1542  BP: 121/51  Pulse: 83  Temp: 98 F (36.7 C)  Weight: 204 lb 4.8 oz (92.67 kg)    Fetal Status: Fetal Heart Rate (bpm): 140   Movement: Present     General:  Alert, oriented and cooperative. Patient is in no acute distress.  Skin: Skin is warm and dry. No rash noted.   Cardiovascular: Normal heart rate noted  Respiratory: Normal respiratory effort, no problems with respiration noted  Abdomen: Soft, gravid, appropriate for gestational age. Pain/Pressure: Present     Pelvic: Vag. Bleeding: None     Cervical exam deferred        Extremities: Normal range of motion.  Edema: Trace  Mental Status: Normal mood and affect. Normal behavior. Normal judgment and thought content.   Urinalysis:     did not leave urine specimen.  Assessment and Plan:  Pregnancy: G1P0 at 3878w4d  1. Supervision of normal first pregnancy, antepartum, second trimester   Preterm labor symptoms and general obstetric precautions including but not limited to vaginal bleeding, contractions, leaking of fluid and fetal movement were reviewed in detail with the patient. Please refer to After Visit Summary for other counseling recommendations.  Return in about 4 weeks (around 11/27/2015).   Dorathy KinsmanVirginia Calloway Andrus, CNM

## 2015-10-30 NOTE — Patient Instructions (Signed)
Tdap Vaccine (Tetanus, Diphtheria and Pertussis): What You Need to Know 1. Why get vaccinated? Tetanus, diphtheria and pertussis are very serious diseases. Tdap vaccine can protect us from these diseases. And, Tdap vaccine given to pregnant women can protect newborn babies against pertussis. TETANUS (Lockjaw) is rare in the [place] today. It causes painful muscle tightening and stiffness, usually all over the body.  It can lead to tightening of muscles in the head and neck so you can't open your mouth, swallow, or sometimes even breathe. Tetanus kills about 1 out of 10 people who are infected even after receiving the best medical care. DIPHTHERIA is also rare in the [place] today. It can cause a thick coating to form in the back of the throat.  It can lead to breathing problems, heart failure, paralysis, and death. PERTUSSIS (Whooping Cough) causes severe coughing spells, which can cause difficulty breathing, vomiting and disturbed sleep.  It can also lead to weight loss, incontinence, and rib fractures. Up to 2 in 100 adolescents and 5 in 100 adults with pertussis are hospitalized or have complications, which could include pneumonia or death. These diseases are caused by bacteria. Diphtheria and pertussis are spread from person to person through secretions from coughing or sneezing. Tetanus enters the body through cuts, scratches, or wounds. Before vaccines, as many as 200,000 cases of diphtheria, 200,000 cases of pertussis, and hundreds of cases of tetanus, were reported in the [place] each year. Since vaccination began, reports of cases for tetanus and diphtheria have dropped by about 99% and for pertussis by about 80%. 2. Tdap vaccine Tdap vaccine can protect adolescents and adults from tetanus, diphtheria, and pertussis. One dose of Tdap is routinely given at age 11 or 12. People who did not get Tdap at that age should get it as soon as possible. Tdap is especially important  for healthcare professionals and anyone having close contact with a baby younger than 12 months. Pregnant women should get a dose of Tdap during every pregnancy, to protect the newborn from pertussis. Infants are most at risk for severe, life-threatening complications from pertussis. Another vaccine, called Td, protects against tetanus and diphtheria, but not pertussis. A Td booster should be given every 10 years. Tdap may be given as one of these boosters if you have never gotten Tdap before. Tdap may also be given after a severe cut or burn to prevent tetanus infection. Your doctor or the person giving you the vaccine can give you more information. Tdap may safely be given at the same time as other vaccines. 3. Some people should not get this vaccine  A person who has ever had a life-threatening allergic reaction after a previous dose of any diphtheria, tetanus or pertussis containing vaccine, OR has a severe allergy to any part of this vaccine, should not get Tdap vaccine. Tell the person giving the vaccine about any severe allergies.  Anyone who had coma or long repeated seizures within 7 days after a childhood dose of DTP or DTaP, or a previous dose of Tdap, should not get Tdap, unless a cause other than the vaccine was found. They can still get Td.  Talk to your doctor if you:  have seizures or another nervous system problem,  had severe pain or swelling after any vaccine containing diphtheria, tetanus or pertussis,  ever had a condition called Guillain-Barr Syndrome (GBS),  aren't feeling well on the day the shot is scheduled. 4. Risks With any medicine, including vaccines, there is   a chance of side effects. These are usually mild and go away on their own. Serious reactions are also possible but are rare. Most people who get Tdap vaccine do not have any problems with it. Mild problems following Tdap (Did not interfere with activities)  Pain where the shot was given (about 3 in 4  adolescents or 2 in 3 adults)  Redness or swelling where the shot was given (about 1 person in 5)  Mild fever of at least 100.4F (up to about 1 in 25 adolescents or 1 in 100 adults)  Headache (about 3 or 4 people in 10)  Tiredness (about 1 person in 3 or 4)  Nausea, vomiting, diarrhea, stomach ache (up to 1 in 4 adolescents or 1 in 10 adults)  Chills, sore joints (about 1 person in 10)  Body aches (about 1 person in 3 or 4)  Rash, swollen glands (uncommon) Moderate problems following Tdap (Interfered with activities, but did not require medical attention)  Pain where the shot was given (up to 1 in 5 or 6)  Redness or swelling where the shot was given (up to about 1 in 16 adolescents or 1 in 12 adults)  Fever over 102F (about 1 in 100 adolescents or 1 in 250 adults)  Headache (about 1 in 7 adolescents or 1 in 10 adults)  Nausea, vomiting, diarrhea, stomach ache (up to 1 or 3 people in 100)  Swelling of the entire arm where the shot was given (up to about 1 in 500). Severe problems following Tdap (Unable to perform usual activities; required medical attention)  Swelling, severe pain, bleeding and redness in the arm where the shot was given (rare). Problems that could happen after any vaccine:  People sometimes faint after a medical procedure, including vaccination. Sitting or lying down for about 15 minutes can help prevent fainting, and injuries caused by a fall. Tell your doctor if you feel dizzy, or have vision changes or ringing in the ears.  Some people get severe pain in the shoulder and have difficulty moving the arm where a shot was given. This happens very rarely.  Any medication can cause a severe allergic reaction. Such reactions from a vaccine are very rare, estimated at fewer than 1 in a million doses, and would happen within a few minutes to a few hours after the vaccination. As with any medicine, there is a very remote chance of a vaccine causing a serious  injury or death. The safety of vaccines is always being monitored. For more information, visit: www.cdc.gov/vaccinesafety/ 5. What if there is a serious problem? What should I look for?  Look for anything that concerns you, such as signs of a severe allergic reaction, very high fever, or unusual behavior.  Signs of a severe allergic reaction can include hives, swelling of the face and throat, difficulty breathing, a fast heartbeat, dizziness, and weakness. These would usually start a few minutes to a few hours after the vaccination. What should I do?  If you think it is a severe allergic reaction or other emergency that can't wait, call 9-1-1 or get the person to the nearest hospital. Otherwise, call your doctor.  Afterward, the reaction should be reported to the Vaccine Adverse Event Reporting System (VAERS). Your doctor might file this report, or you can do it yourself through the VAERS web site at www.vaers.hhs.gov, or by calling 1-800-822-7967. VAERS does not give medical advice.  6. The National Vaccine Injury Compensation Program The National Vaccine Injury Compensation Program (  VICP) is a federal program that was created to compensate people who may have been injured by certain vaccines. Persons who believe they may have been injured by a vaccine can learn about the program and about filing a claim by calling 1-800-338-2382 or visiting the VICP website at www.hrsa.gov/vaccinecompensation. There is a time limit to file a claim for compensation. 7. How can I learn more?  Ask your doctor. He or she can give you the vaccine package insert or suggest other sources of information.  Call your local or state health department.  Contact the Centers for Disease Control and Prevention (CDC):  Call 1-800-232-4636 (1-800-CDC-INFO) or  Visit CDC's website at www.cdc.gov/vaccines CDC Tdap Vaccine VIS (09/11/13)   This information is not intended to replace advice given to you by your health care  provider. Make sure you discuss any questions you have with your health care provider.   Document Released: 01/04/2012 Document Revised: 07/26/2014 Document Reviewed: 10/17/2013 Elsevier Interactive Patient Education 2016 Elsevier Inc.  

## 2015-11-02 ENCOUNTER — Encounter: Payer: Self-pay | Admitting: Advanced Practice Midwife

## 2015-12-02 ENCOUNTER — Encounter: Payer: Self-pay | Admitting: Family

## 2015-12-03 ENCOUNTER — Encounter (HOSPITAL_COMMUNITY): Payer: Self-pay

## 2015-12-03 ENCOUNTER — Inpatient Hospital Stay (HOSPITAL_COMMUNITY)
Admission: AD | Admit: 2015-12-03 | Discharge: 2015-12-03 | Discharge status: Against Medical Advice | Payer: Self-pay | Source: Ambulatory Visit | Attending: Obstetrics and Gynecology | Admitting: Obstetrics and Gynecology

## 2015-12-03 DIAGNOSIS — S59912A Unspecified injury of left forearm, initial encounter: Secondary | ICD-10-CM

## 2015-12-03 DIAGNOSIS — O26892 Other specified pregnancy related conditions, second trimester: Secondary | ICD-10-CM | POA: Insufficient documentation

## 2015-12-03 DIAGNOSIS — O9A212 Injury, poisoning and certain other consequences of external causes complicating pregnancy, second trimester: Secondary | ICD-10-CM

## 2015-12-03 DIAGNOSIS — Z87891 Personal history of nicotine dependence: Secondary | ICD-10-CM | POA: Insufficient documentation

## 2015-12-03 DIAGNOSIS — Z3A27 27 weeks gestation of pregnancy: Secondary | ICD-10-CM | POA: Insufficient documentation

## 2015-12-03 DIAGNOSIS — Z3402 Encounter for supervision of normal first pregnancy, second trimester: Secondary | ICD-10-CM

## 2015-12-03 DIAGNOSIS — S8992XA Unspecified injury of left lower leg, initial encounter: Secondary | ICD-10-CM

## 2015-12-03 DIAGNOSIS — S80212A Abrasion, left knee, initial encounter: Secondary | ICD-10-CM | POA: Insufficient documentation

## 2015-12-03 LAB — URINALYSIS, ROUTINE W REFLEX MICROSCOPIC
Bilirubin Urine: NEGATIVE
GLUCOSE, UA: NEGATIVE mg/dL
HGB URINE DIPSTICK: NEGATIVE
Ketones, ur: NEGATIVE mg/dL
Nitrite: NEGATIVE
PH: 6 (ref 5.0–8.0)
Protein, ur: 100 mg/dL — AB
Specific Gravity, Urine: >1.03 — ABNORMAL HIGH (ref 1.005–1.030)

## 2015-12-03 LAB — URINE MICROSCOPIC-ADD ON: RBC / HPF: NONE SEEN RBC/hpf (ref 0–5)
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
## 2015-12-03 NOTE — MAU Note (Signed)
Urine in lab 

## 2015-12-03 NOTE — MAU Note (Signed)
After speaking with CNM about 4 hours of monitoring, pt called RN to room stating that she does not want to stay for the 4 hours due to transportation problems. States her pain is getting better and she just wanted to come in and check the baby. RN counseled patient about reasoning for 4 hours of monitoring and risk of leaving. Pt verbalized understanding and does not want to stay. Pt signed AMA paperwork before leaving.

## 2016-01-08 ENCOUNTER — Ambulatory Visit (INDEPENDENT_AMBULATORY_CARE_PROVIDER_SITE_OTHER): Payer: Self-pay | Admitting: Family Medicine

## 2016-01-08 VITALS — BP 97/58 | HR 85 | Wt 206.1 lb

## 2016-01-08 DIAGNOSIS — Z3403 Encounter for supervision of normal first pregnancy, third trimester: Secondary | ICD-10-CM

## 2016-01-08 LAB — POCT URINALYSIS DIP (DEVICE)
GLUCOSE, UA: NEGATIVE mg/dL
Ketones, ur: 15 mg/dL — AB
Nitrite: NEGATIVE
PROTEIN: 30 mg/dL — AB
Specific Gravity, Urine: 1.025 (ref 1.005–1.030)
UROBILINOGEN UA: 2 mg/dL — AB (ref 0.0–1.0)
pH: 6.5 (ref 5.0–8.0)

## 2016-01-08 LAB — CBC
HEMATOCRIT: 30 % — AB (ref 35.0–45.0)
HEMOGLOBIN: 9.8 g/dL — AB (ref 11.7–15.5)
MCH: 26.8 pg — AB (ref 27.0–33.0)
MCHC: 32.7 g/dL (ref 32.0–36.0)
MCV: 82.2 fL (ref 80.0–100.0)
MPV: 9.3 fL (ref 7.5–12.5)
Platelets: 270 10*3/uL (ref 140–400)
RBC: 3.65 MIL/uL — AB (ref 3.80–5.10)
RDW: 13.8 % (ref 11.0–15.0)
WBC: 7.9 10*3/uL (ref 3.8–10.8)

## 2016-01-08 NOTE — Progress Notes (Signed)
Subjective:  Michelle Shah is a 25 y.o. G1P0 at 4925w4d being seen today for ongoing prenatal care.  She is currently monitored for the following issues for this low-risk pregnancy and has Supervision of normal first pregnancy, antepartum on her problem list.  Patient reports no complaints.  Contractions: Irritability. Vag. Bleeding: None.  Movement: Present. Denies leaking of fluid.   The following portions of the patient's history were reviewed and updated as appropriate: allergies, current medications, past family history, past medical history, past social history, past surgical history and problem list. Problem list updated.  Objective:   Filed Vitals:   01/08/16 1036  BP: 97/58  Pulse: 85  Weight: 206 lb 1.6 oz (93.486 kg)    Fetal Status: Fetal Heart Rate (bpm): 145   Movement: Present     General:  Alert, oriented and cooperative. Patient is in no acute distress.  Skin: Skin is warm and dry. No rash noted.   Cardiovascular: Normal heart rate noted  Respiratory: Normal respiratory effort, no problems with respiration noted  Abdomen: Soft, gravid, appropriate for gestational age. Pain/Pressure: Present     Pelvic: Cervical exam deferred        Extremities: Normal range of motion.  Edema: Trace  Mental Status: Normal mood and affect. Normal behavior. Normal judgment and thought content.   Urinalysis: Urine Protein: 1+ Urine Glucose: Negative  Assessment and Plan:  Pregnancy: G1P0 at 5325w4d  1. Supervision of normal first pregnancy, antepartum, third trimester - updated box - 1 hr Gtt and other 28 week labs - HIV antibody (with reflex) - RPR - CBC - Glucose Tolerance, 1 HR (50g)  Preterm labor symptoms and general obstetric precautions including but not limited to vaginal bleeding, contractions, leaking of fluid and fetal movement were reviewed in detail with the patient. Please refer to After Visit Summary for other counseling recommendations.  Return in about 2 weeks  (around 01/22/2016).   Federico FlakeKimberly Niles Nathanyl Andujo, MD

## 2016-01-08 NOTE — Patient Instructions (Signed)

## 2016-01-08 NOTE — Progress Notes (Signed)
1hr gtt/28 week labs URINE: LARGE AMT WBCS, TRACE HGB

## 2016-01-09 LAB — GLUCOSE TOLERANCE, 1 HOUR (50G) W/O FASTING: GLUCOSE, 1 HR, GESTATIONAL: 89 mg/dL (ref <140)

## 2016-01-09 LAB — SYPHILIS: RPR W/REFLEX TO RPR TITER AND TREPONEMAL ANTIBODIES, TRADITIONAL SCREENING AND DIAGNOSIS ALGORITHM

## 2016-01-09 LAB — HIV ANTIBODY (ROUTINE TESTING W REFLEX): HIV 1&2 Ab, 4th Generation: NONREACTIVE

## 2016-01-28 ENCOUNTER — Encounter: Payer: Self-pay | Admitting: Obstetrics & Gynecology

## 2016-02-10 ENCOUNTER — Ambulatory Visit (INDEPENDENT_AMBULATORY_CARE_PROVIDER_SITE_OTHER): Payer: Self-pay | Admitting: Clinical

## 2016-02-10 ENCOUNTER — Encounter: Payer: Self-pay | Admitting: Student

## 2016-02-10 ENCOUNTER — Ambulatory Visit (INDEPENDENT_AMBULATORY_CARE_PROVIDER_SITE_OTHER): Payer: Self-pay | Admitting: Student

## 2016-02-10 VITALS — BP 114/65 | HR 67 | Wt 217.7 lb

## 2016-02-10 DIAGNOSIS — Z23 Encounter for immunization: Secondary | ICD-10-CM

## 2016-02-10 DIAGNOSIS — F4323 Adjustment disorder with mixed anxiety and depressed mood: Secondary | ICD-10-CM

## 2016-02-10 DIAGNOSIS — Z3403 Encounter for supervision of normal first pregnancy, third trimester: Secondary | ICD-10-CM

## 2016-02-10 LAB — POCT URINALYSIS DIP (DEVICE)
Glucose, UA: NEGATIVE mg/dL
Ketones, ur: NEGATIVE mg/dL
Nitrite: NEGATIVE
Protein, ur: 30 mg/dL — AB
Specific Gravity, Urine: 1.025 (ref 1.005–1.030)
Urobilinogen, UA: 2 mg/dL — ABNORMAL HIGH (ref 0.0–1.0)
pH: 7 (ref 5.0–8.0)

## 2016-02-10 LAB — OB RESULTS CONSOLE GBS: STREP GROUP B AG: POSITIVE

## 2016-02-10 NOTE — Patient Instructions (Signed)

## 2016-02-10 NOTE — Progress Notes (Signed)
  ASSESSMENT: Pt currently experiencing Adjustment disorder with mixed anxious and depressed mood. Pt needs to f/u with OB and [facility].  Pt would benefit from psychoeducation and brief therapeutic interventions regarding coping with symptoms of anxiety and depression. Stage of Change: contemplative  PLAN: 1. F/U with behavioral health clinician in two months, or as needed  2. Psychiatric Medications: none 3. Behavioral recommendations:   -Practice daily relaxation breathing exercises, as practiced in office visit, prior to sleep (and throughout the day, as needed) -Read educational material regarding coping with symptoms of anxiety and depression  5. From scale of 1-10, how likely are you to follow plan: 10  SUBJECTIVE: Pt. referred by Judeth Horn, NP for symptoms of depression and anxiety Pt. reports the following symptoms/concerns: Pt states that she went through a difficult time a few months previous, after losing job and housing, and has experienced feelings of mild depression during times of stress; primary concern today is feelings of anxiety related to upcoming birth and potential pain, along with difficulty falling asleep. Duration of problem: Increase in past month Severity: mild   OBJECTIVE: Orientation & Cognition: Oriented x3. Thought processes normal and appropriate to situation. Mood: appropriate Affect: appropriate Appearance: appropriate Risk of harm to self or others:  "never had any suicidal thoughts, or anything like that", no known risk of harm to self or others (pts says her positive score on screening is from several months ago of feeling really down because of life uncertainties, not from SI, and never had thoughts of hurting herself) Substance use: none Assessments administered: PHQ9: 13/GAD7: 10  Diagnosis: Adjustment disorder with mixed anxious and depressed mood CPT Code: F43.23  -------------------------------------------- Other(s) present in the room:  none  Time spent with patient in exam room: 30 minutes, 10:55-11:25 am   Depression screen [facility] 2/9 02/10/2016  Decreased Interest 2  Down, Depressed, Hopeless 2  PHQ - 2 Score 4  Altered sleeping 3  Tired, decreased energy 1  Change in appetite 1  Feeling bad or failure about yourself  1  Trouble concentrating 1  Moving slowly or fidgety/restless 0  Suicidal thoughts 1  PHQ-9 Score 12   GAD 7 : Generalized Anxiety Score 02/10/2016  Nervous, Anxious, on Edge 2  Control/stop worrying 1  Worry too much - different things 2  Trouble relaxing 1  Restless 3  Easily annoyed or irritable 1  Afraid - awful might happen 0  Total GAD 7 Score 10

## 2016-02-10 NOTE — Progress Notes (Signed)
Subjective:  Michelle Shah is a 25 y.o. G1P0 at [redacted]w[redacted]d being seen today for ongoing prenatal care.  She is currently monitored for the following issues for this low-risk pregnancy and has Supervision of normal first pregnancy, antepartum on her problem list.  Patient reports no complaints.  Contractions: Irritability.  .  Movement: Present. Denies leaking of fluid.   The following portions of the patient's history were reviewed and updated as appropriate: allergies, current medications, past family history, past medical history, past social history, past surgical history and problem list. Problem list updated.  Objective:   Vitals:   02/10/16 1035  BP: 114/65  Pulse: 67  Weight: 217 lb 11.2 oz (98.7 kg)    Fetal Status: Fetal Heart Rate (bpm): 131 Fundal Height: 38 cm Movement: Present  Presentation: Vertex  General:  Alert, oriented and cooperative. Patient is in no acute distress.  Skin: Skin is warm and dry. No rash noted.   Cardiovascular: Normal heart rate noted  Respiratory: Normal respiratory effort, no problems with respiration noted  Abdomen: Soft, gravid, appropriate for gestational age. Pain/Pressure: Present     Pelvic:  Cervical exam performed Dilation: 1.5 Effacement (%): 50 Station: -3  Extremities: Normal range of motion.     Mental Status: Normal mood and affect. Normal behavior. Normal judgment and thought content.   Urinalysis:      Assessment and Plan:  Pregnancy: G1P0 at [redacted]w[redacted]d  1. Supervision of normal first pregnancy, antepartum, third trimester  - Culture, beta strep (group b only) - GC/Chlamydia probe amp ([place])not at [facility]  Term labor symptoms and general obstetric precautions including but not limited to vaginal bleeding, contractions, leaking of fluid and fetal movement were reviewed in detail with the patient. Please refer to After Visit Summary for other counseling recommendations.  Return in about 1 week (around 02/17/2016) for Routine  OB.   Judeth Horn, NP

## 2016-02-11 LAB — CULTURE, BETA STREP (GROUP B ONLY)

## 2016-02-15 ENCOUNTER — Inpatient Hospital Stay (HOSPITAL_COMMUNITY)
Admission: AD | Admit: 2016-02-15 | Discharge: 2016-02-18 | DRG: 775 | Disposition: A | Discharge status: Home / Self Care | Payer: Medicaid Other | Source: Ambulatory Visit | Attending: Family Medicine | Admitting: Family Medicine

## 2016-02-15 ENCOUNTER — Encounter (HOSPITAL_COMMUNITY): Payer: Self-pay

## 2016-02-15 ENCOUNTER — Inpatient Hospital Stay (HOSPITAL_COMMUNITY): Payer: Medicaid Other | Admitting: Anesthesiology

## 2016-02-15 DIAGNOSIS — O4202 Full-term premature rupture of membranes, onset of labor within 24 hours of rupture: Secondary | ICD-10-CM | POA: Diagnosis present

## 2016-02-15 DIAGNOSIS — Z3A38 38 weeks gestation of pregnancy: Secondary | ICD-10-CM

## 2016-02-15 DIAGNOSIS — Z833 Family history of diabetes mellitus: Secondary | ICD-10-CM

## 2016-02-15 DIAGNOSIS — O99214 Obesity complicating childbirth: Secondary | ICD-10-CM | POA: Diagnosis present

## 2016-02-15 DIAGNOSIS — Z6841 Body Mass Index (BMI) 40.0 and over, adult: Secondary | ICD-10-CM

## 2016-02-15 DIAGNOSIS — O99824 Streptococcus B carrier state complicating childbirth: Secondary | ICD-10-CM | POA: Diagnosis present

## 2016-02-15 DIAGNOSIS — Z87891 Personal history of nicotine dependence: Secondary | ICD-10-CM

## 2016-02-15 DIAGNOSIS — Z3403 Encounter for supervision of normal first pregnancy, third trimester: Secondary | ICD-10-CM

## 2016-02-15 LAB — ABO/RH: ABO/RH(D): O POS

## 2016-02-15 LAB — POCT FERN TEST: POCT FERN TEST: POSITIVE

## 2016-02-15 LAB — CBC
HEMATOCRIT: 28.4 % — AB (ref 36.0–46.0)
Hemoglobin: 9.3 g/dL — ABNORMAL LOW (ref 12.0–15.0)
MCH: 25.4 pg — AB (ref 26.0–34.0)
MCHC: 32.7 g/dL (ref 30.0–36.0)
MCV: 77.6 fL — ABNORMAL LOW (ref 78.0–100.0)
Platelets: 232 10*3/uL (ref 150–400)
RBC: 3.66 MIL/uL — ABNORMAL LOW (ref 3.87–5.11)
RDW: 13.9 % (ref 11.5–15.5)
WBC: 12 10*3/uL — ABNORMAL HIGH (ref 4.0–10.5)

## 2016-02-15 LAB — TYPE AND SCREEN
ABO/RH(D): O POS
ANTIBODY SCREEN: NEGATIVE

## 2016-02-15 MED ORDER — LIDOCAINE HCL (PF) 1 % IJ SOLN
INTRAMUSCULAR | Status: DC | PRN
Start: 2016-02-15 — End: 2016-02-16
  Administered 2016-02-15 (×2): 4 mL

## 2016-02-15 MED ORDER — SOD CITRATE-CITRIC ACID 500-334 MG/5ML PO SOLN
30.0000 mL | ORAL | Status: DC | PRN
  Filled 2016-02-15: qty 15

## 2016-02-15 MED ORDER — OXYCODONE-ACETAMINOPHEN 5-325 MG PO TABS
1.0000 | ORAL_TABLET | ORAL | Status: DC | PRN
  Administered 2016-02-16: 1 via ORAL
  Filled 2016-02-15: qty 1

## 2016-02-15 MED ORDER — ACETAMINOPHEN 325 MG PO TABS
650.0000 mg | ORAL_TABLET | ORAL | Status: DC | PRN
Start: 2016-02-15 — End: 2016-02-16

## 2016-02-15 MED ORDER — TERBUTALINE SULFATE 1 MG/ML IJ SOLN
0.2500 mg | Freq: Once | INTRAMUSCULAR | Status: DC | PRN
  Filled 2016-02-15: qty 1

## 2016-02-15 MED ORDER — PHENYLEPHRINE 40 MCG/ML (10ML) SYRINGE FOR IV PUSH (FOR BLOOD PRESSURE SUPPORT)
80.0000 ug | PREFILLED_SYRINGE | INTRAVENOUS | Status: DC | PRN
  Filled 2016-02-15: qty 10
  Filled 2016-02-15: qty 5

## 2016-02-15 MED ORDER — NALBUPHINE HCL 10 MG/ML IJ SOLN
10.0000 mg | Freq: Once | INTRAMUSCULAR | Status: AC
  Administered 2016-02-15: 10 mg via INTRAVENOUS
  Filled 2016-02-15: qty 1

## 2016-02-15 MED ORDER — EPHEDRINE 5 MG/ML INJ
10.0000 mg | INTRAVENOUS | Status: DC | PRN
  Filled 2016-02-15: qty 4

## 2016-02-15 MED ORDER — ONDANSETRON HCL 4 MG/2ML IJ SOLN: 4.0000 mg | Freq: Four times a day (QID) | INTRAMUSCULAR | Status: DC | PRN

## 2016-02-15 MED ORDER — PHENYLEPHRINE 40 MCG/ML (10ML) SYRINGE FOR IV PUSH (FOR BLOOD PRESSURE SUPPORT)
80.0000 ug | PREFILLED_SYRINGE | INTRAVENOUS | Status: DC | PRN
  Filled 2016-02-15: qty 5

## 2016-02-15 MED ORDER — PENICILLIN G POTASSIUM 5000000 UNITS IJ SOLR
5.0000 10*6.[IU] | Freq: Once | INTRAMUSCULAR | Status: AC
  Administered 2016-02-15: 5 10*6.[IU] via INTRAVENOUS
  Filled 2016-02-15: qty 5

## 2016-02-15 MED ORDER — LACTATED RINGERS IV SOLN
INTRAVENOUS | Status: DC
  Administered 2016-02-15 – 2016-02-16 (×4): via INTRAVENOUS

## 2016-02-15 MED ORDER — PROMETHAZINE HCL 25 MG/ML IJ SOLN
12.5000 mg | Freq: Once | INTRAMUSCULAR | Status: AC
  Administered 2016-02-15: 12.5 mg via INTRAVENOUS
  Filled 2016-02-15: qty 1

## 2016-02-15 MED ORDER — PENICILLIN G POTASSIUM 5000000 UNITS IJ SOLR
2.5000 10*6.[IU] | INTRAVENOUS | Status: DC
  Administered 2016-02-15 – 2016-02-16 (×3): 2.5 10*6.[IU] via INTRAVENOUS
  Filled 2016-02-15 (×8): qty 2.5

## 2016-02-15 MED ORDER — MISOPROSTOL 25 MCG QUARTER TABLET
25.0000 ug | ORAL_TABLET | ORAL | Status: DC | PRN
  Administered 2016-02-15: 25 ug via VAGINAL
  Filled 2016-02-15: qty 1
  Filled 2016-02-15: qty 0.25

## 2016-02-15 MED ORDER — OXYTOCIN 40 UNITS IN LACTATED RINGERS INFUSION - SIMPLE MED
2.5000 [IU]/h | INTRAVENOUS | Status: DC
  Filled 2016-02-15: qty 1000

## 2016-02-15 MED ORDER — FLEET ENEMA 7-19 GM/118ML RE ENEM: 1.0000 | ENEMA | RECTAL | Status: DC | PRN

## 2016-02-15 MED ORDER — OXYTOCIN BOLUS FROM INFUSION: 500.0000 mL | Freq: Once | INTRAVENOUS | Status: DC

## 2016-02-15 MED ORDER — LACTATED RINGERS IV SOLN
500.0000 mL | Freq: Once | INTRAVENOUS | Status: AC
  Administered 2016-02-15: 500 mL via INTRAVENOUS

## 2016-02-15 MED ORDER — OXYCODONE-ACETAMINOPHEN 5-325 MG PO TABS: 2.0000 | ORAL_TABLET | ORAL | Status: DC | PRN

## 2016-02-15 MED ORDER — LIDOCAINE HCL (PF) 1 % IJ SOLN
30.0000 mL | INTRAMUSCULAR | Status: DC | PRN
  Administered 2016-02-16: 30 mL via SUBCUTANEOUS
  Filled 2016-02-15: qty 30

## 2016-02-15 MED ORDER — FENTANYL 2.5 MCG/ML BUPIVACAINE 1/10 % EPIDURAL INFUSION (WH - ANES)
14.0000 mL/h | INTRAMUSCULAR | Status: DC | PRN
  Administered 2016-02-15 – 2016-02-16 (×2): 14 mL/h via EPIDURAL
  Filled 2016-02-15 (×2): qty 125

## 2016-02-15 MED ORDER — LACTATED RINGERS IV SOLN: 500.0000 mL | INTRAVENOUS | Status: DC | PRN

## 2016-02-15 MED ORDER — DIPHENHYDRAMINE HCL 50 MG/ML IJ SOLN: 12.5000 mg | INTRAMUSCULAR | Status: DC | PRN

## 2016-02-15 NOTE — H&P (Signed)
Michelle Shah is a 25 y.o. female presenting for SROM  @ 0415 this morning.GBS pos OB History    Gravida Para Term Preterm AB Living   1             SAB TAB Ectopic Multiple Live Births                 Past Medical History:  Diagnosis Date  . Anemia   . Anxiety   . Depression   . Seasonal allergies    Past Surgical History:  Procedure Laterality Date  . NO PAST SURGERIES     Family History: family history includes Diabetes in her father, paternal aunt, and paternal uncle. Social History:  reports that she quit smoking about 5 months ago. She has never used smokeless tobacco. She reports that she does not drink alcohol or use drugs.     Maternal Diabetes: No Genetic Screening: Normal Maternal Ultrasounds/Referrals: Normal Fetal Ultrasounds or other Referrals:  None Maternal Substance Abuse:  No Significant Maternal Medications:  None Significant Maternal Lab Results:  Lab values include: Group B Strep positive Other Comments:  None  Review of Systems  Constitutional: Negative.   HENT: Negative.   Eyes: Negative.   Respiratory: Negative.   Cardiovascular: Negative.   Gastrointestinal: Positive for abdominal pain.  Genitourinary: Negative.   Musculoskeletal: Negative.   Skin: Negative.   Neurological: Negative.   Endo/Heme/Allergies: Negative.   Psychiatric/Behavioral: Negative.    Maternal Medical History:  Reason for admission: Rupture of membranes.   Contractions: Onset was 1-2 hours ago.   Frequency: irregular.   Perceived severity is strong.    Fetal activity: Perceived fetal activity is normal.   Last perceived fetal movement was within the past hour.    Prenatal complications: no prenatal complications Prenatal Complications - Diabetes: none.    Dilation: 1.5 Effacement (%): Thick Station: -3 Exam by:: Camelia Eng, RN Temperature 98.2 F (36.8 C), temperature source Oral, resp. rate 18, height 5' (1.524 m), weight 217 lb (98.4 kg), last menstrual  period 05/25/2015. Maternal Exam:  Uterine Assessment: Contraction strength is mild.  Contraction frequency is regular.   Abdomen: Patient reports no abdominal tenderness. Estimated fetal weight is 8.0.   Fetal presentation: vertex  Introitus: Normal vulva. Normal vagina.  Ferning test: not done.  Nitrazine test: not done. Amniotic fluid character: clear.  Pelvis: adequate for delivery.   Cervix: Cervix evaluated by digital exam.     Fetal Exam Fetal Monitor Review: Mode: ultrasound.   Variability: moderate (6-25 bpm).   Pattern: accelerations present.    Fetal State Assessment: Category I - tracings are normal.     Physical Exam  Constitutional: She is oriented to person, place, and time. She appears well-developed and well-nourished.  HENT:  Head: Normocephalic.  Eyes: Pupils are equal, round, and reactive to light.  Neck: Normal range of motion.  Cardiovascular: Normal rate, regular rhythm, normal heart sounds and intact distal pulses.   Respiratory: Effort normal and breath sounds normal.  GI: Soft. Bowel sounds are normal.  Genitourinary: Vagina normal and uterus normal.  Musculoskeletal: Normal range of motion.  Neurological: She is alert and oriented to person, place, and time. She has normal reflexes.  Skin: Skin is warm and dry.  Psychiatric: She has a normal mood and affect. Her behavior is normal. Judgment and thought content normal.    Prenatal labs: ABO, Rh: O/POS/-- (02/16 1010) Antibody: NEG (02/16 1010) Rubella: 1.80 (02/16 1010) RPR: NON REAC (06/22 1426)  HBsAg: NEGATIVE (02/16 1010)  HIV: NONREACTIVE (06/22 1426)  GBS:     Assessment/Plan: SROM, cl/th/post -2. GBS pos. Admit and start pit aug   Wyvonnia Dusky 02/15/2016, 2:29 PM

## 2016-02-15 NOTE — Anesthesia Procedure Notes (Signed)
Epidural Patient location during procedure: OB  Staffing Anesthesiologist: Clarie Camey Performed: anesthesiologist   Preanesthetic Checklist Completed: patient identified, site marked, surgical consent, pre-op evaluation, timeout performed, IV checked, risks and benefits discussed and monitors and equipment checked  Epidural Patient position: sitting Prep: site prepped and draped and DuraPrep Patient monitoring: continuous pulse ox and blood pressure Approach: midline Location: L3-L4 Injection technique: LOR saline  Needle:  Needle type: Tuohy  Needle gauge: 17 G Needle length: 9 cm and 9 Needle insertion depth: 7 cm Catheter type: closed end flexible Catheter size: 19 Gauge Catheter at skin depth: 12 cm Test dose: negative  Assessment Events: blood not aspirated, injection not painful, no injection resistance, negative IV test and no paresthesia  Additional Notes Patient identified. Risks/Benefits/Options discussed with patient including but not limited to bleeding, infection, nerve damage, paralysis, failed block, incomplete pain control, headache, blood pressure changes, nausea, vomiting, reactions to medication both or allergic, itching and postpartum back pain. Confirmed with bedside nurse the patient's most recent platelet count. Confirmed with patient that they are not currently taking any anticoagulation, have any bleeding history or any family history of bleeding disorders. Patient expressed understanding and wished to proceed. All questions were answered. Sterile technique was used throughout the entire procedure. Please see nursing notes for vital signs. Test dose was given through epidural catheter and negative prior to continuing to dose epidural or start infusion. Warning signs of high block given to the patient including shortness of breath, tingling/numbness in hands, complete motor block, or any concerning symptoms with instructions to call for help. Patient was given  instructions on fall risk and not to get out of bed. All questions and concerns addressed with instructions to call with any issues or inadequate analgesia.        

## 2016-02-15 NOTE — Progress Notes (Signed)
Mariselda L Barefield is a 25 y.o. G1P0 at [redacted]w[redacted]d by ultrasound admitted for rupture of membranes  Subjective:   Objective: BP 123/71   Pulse 88   Temp 98.3 F (36.8 C) (Oral)   Resp 18   Ht 5' (1.524 m)   Wt 217 lb (98.4 kg)   LMP 05/25/2015   BMI 42.38 kg/m  No intake/output data recorded. No intake/output data recorded.  FHT:  FHR: 150 bpm, variability: moderate,  accelerations:  Present,  decelerations:  Absent UC:   regular, every 2 minutes SVE:   Dilation: 5 Effacement (%): 60 Station: -3 Exam by:: Camelia Eng, RN  Labs: Lab Results  Component Value Date   WBC 12.0 (H) 02/15/2016   HGB 9.3 (L) 02/15/2016   HCT 28.4 (L) 02/15/2016   MCV 77.6 (L) 02/15/2016   PLT 232 02/15/2016    Assessment / Plan: Augmentation of labor, progressing well  Labor: Progressing normally Preeclampsia:  no signs or symptoms of toxicity and intake and ouput balanced Fetal Wellbeing:  Category I Pain Control:  Epidural I/D:  n/a Anticipated MOD:  NSVD  Wyvonnia Dusky 02/15/2016, 7:20 PM

## 2016-02-15 NOTE — MAU Note (Signed)
Felt gushes of fluid starting at 4:16am.  Has been having contractions since.  Every 4-5 minutes

## 2016-02-15 NOTE — Anesthesia Preprocedure Evaluation (Signed)
Anesthesia Evaluation  Patient identified by MRN, date of birth, ID band Patient awake    Reviewed: Allergy & Precautions, NPO status , Patient's Chart, lab work & pertinent test results  Airway Mallampati: II  TM Distance: >3 FB Neck ROM: Full    Dental no notable dental hx. (+) Dental Advisory Given   Pulmonary former smoker,    Pulmonary exam normal breath sounds clear to auscultation       Cardiovascular negative cardio ROS Normal cardiovascular exam Rhythm:Regular Rate:Normal     Neuro/Psych PSYCHIATRIC DISORDERS Anxiety Depression negative neurological ROS     GI/Hepatic negative GI ROS, Neg liver ROS,   Endo/Other  Morbid obesity  Renal/GU negative Renal ROS  negative genitourinary   Musculoskeletal negative musculoskeletal ROS (+)   Abdominal   Peds negative pediatric ROS (+)  Hematology  (+) anemia ,   Anesthesia Other Findings   Reproductive/Obstetrics (+) Pregnancy                             Anesthesia Physical Anesthesia Plan  ASA: III  Anesthesia Plan: Epidural   Post-op Pain Management:    Induction:   Airway Management Planned:   Additional Equipment:   Intra-op Plan:   Post-operative Plan:   Informed Consent: I have reviewed the patients History and Physical, chart, labs and discussed the procedure including the risks, benefits and alternatives for the proposed anesthesia with the patient or authorized representative who has indicated his/her understanding and acceptance.   Dental advisory given  Plan Discussed with: CRNA  Anesthesia Plan Comments:         Anesthesia Quick Evaluation

## 2016-02-16 ENCOUNTER — Encounter (HOSPITAL_COMMUNITY): Payer: Self-pay | Admitting: *Deleted

## 2016-02-16 DIAGNOSIS — O99824 Streptococcus B carrier state complicating childbirth: Secondary | ICD-10-CM

## 2016-02-16 DIAGNOSIS — Z3A38 38 weeks gestation of pregnancy: Secondary | ICD-10-CM

## 2016-02-16 LAB — RPR: RPR Ser Ql: NONREACTIVE

## 2016-02-16 MED ORDER — SIMETHICONE 80 MG PO CHEW: 80.0000 mg | CHEWABLE_TABLET | ORAL | Status: DC | PRN

## 2016-02-16 MED ORDER — TERBUTALINE SULFATE 1 MG/ML IJ SOLN
0.2500 mg | Freq: Once | INTRAMUSCULAR | Status: DC | PRN
  Filled 2016-02-16: qty 1

## 2016-02-16 MED ORDER — OXYTOCIN 40 UNITS IN LACTATED RINGERS INFUSION - SIMPLE MED
1.0000 m[IU]/min | INTRAVENOUS | Status: DC
  Administered 2016-02-16: 2 m[IU]/min via INTRAVENOUS

## 2016-02-16 MED ORDER — PRENATAL MULTIVITAMIN CH
1.0000 | ORAL_TABLET | Freq: Every day | ORAL | Status: DC
  Administered 2016-02-16 – 2016-02-18 (×3): 1 via ORAL
  Filled 2016-02-16 (×3): qty 1

## 2016-02-16 MED ORDER — DIPHENHYDRAMINE HCL 25 MG PO CAPS: 25.0000 mg | ORAL_CAPSULE | Freq: Four times a day (QID) | ORAL | Status: DC | PRN

## 2016-02-16 MED ORDER — ONDANSETRON HCL 4 MG/2ML IJ SOLN: 4.0000 mg | INTRAMUSCULAR | Status: DC | PRN

## 2016-02-16 MED ORDER — SENNOSIDES-DOCUSATE SODIUM 8.6-50 MG PO TABS
2.0000 | ORAL_TABLET | ORAL | Status: DC
  Administered 2016-02-16 – 2016-02-18 (×2): 2 via ORAL
  Filled 2016-02-16 (×2): qty 2

## 2016-02-16 MED ORDER — DIBUCAINE 1 % RE OINT: 1.0000 "application " | TOPICAL_OINTMENT | RECTAL | Status: DC | PRN

## 2016-02-16 MED ORDER — SODIUM CHLORIDE 0.9% FLUSH: 3.0000 mL | INTRAVENOUS | Status: DC | PRN

## 2016-02-16 MED ORDER — IBUPROFEN 600 MG PO TABS
600.0000 mg | ORAL_TABLET | Freq: Four times a day (QID) | ORAL | Status: DC
  Administered 2016-02-16 – 2016-02-18 (×9): 600 mg via ORAL
  Filled 2016-02-16 (×9): qty 1

## 2016-02-16 MED ORDER — MEASLES, MUMPS & RUBELLA VAC ~~LOC~~ INJ: 0.5000 mL | INJECTION | Freq: Once | SUBCUTANEOUS | Status: DC

## 2016-02-16 MED ORDER — ONDANSETRON HCL 4 MG PO TABS: 4.0000 mg | ORAL_TABLET | ORAL | Status: DC | PRN

## 2016-02-16 MED ORDER — ACETAMINOPHEN 325 MG PO TABS
650.0000 mg | ORAL_TABLET | ORAL | Status: DC | PRN
Start: 2016-02-16 — End: 2016-02-18
  Administered 2016-02-16 (×2): 650 mg via ORAL
  Filled 2016-02-16 (×2): qty 2

## 2016-02-16 MED ORDER — TETANUS-DIPHTH-ACELL PERTUSSIS 5-2.5-18.5 LF-MCG/0.5 IM SUSP: 0.5000 mL | Freq: Once | INTRAMUSCULAR | Status: DC

## 2016-02-16 MED ORDER — SODIUM CHLORIDE 0.9 % IV SOLN: 250.0000 mL | INTRAVENOUS | Status: DC | PRN

## 2016-02-16 MED ORDER — WITCH HAZEL-GLYCERIN EX PADS: 1.0000 "application " | MEDICATED_PAD | CUTANEOUS | Status: DC | PRN

## 2016-02-16 MED ORDER — SODIUM CHLORIDE 0.9% FLUSH: 3.0000 mL | Freq: Two times a day (BID) | INTRAVENOUS | Status: DC

## 2016-02-16 MED ORDER — COCONUT OIL OIL
1.0000 "application " | TOPICAL_OIL | Status: DC | PRN
  Administered 2016-02-16: 1 via TOPICAL
  Filled 2016-02-16: qty 120

## 2016-02-16 MED ORDER — ZOLPIDEM TARTRATE 5 MG PO TABS: 5.0000 mg | ORAL_TABLET | Freq: Every evening | ORAL | Status: DC | PRN

## 2016-02-16 MED ORDER — BENZOCAINE-MENTHOL 20-0.5 % EX AERO
1.0000 "application " | INHALATION_SPRAY | CUTANEOUS | Status: DC | PRN
  Administered 2016-02-16: 1 via TOPICAL
  Filled 2016-02-16: qty 56

## 2016-02-16 NOTE — Progress Notes (Signed)
Patient ID: Michelle Shah, female   DOB: 1991/07/09, 25 y.o.   MRN: [id number] S: comfortable from epidural O: VSS, FHR pattern prolonegd decel x 12 min with variable decels noted  A; Dr Shawnie Pons called to evaluate FHR pattern P: careful obs. Dr. Shawnie Pons in attendance

## 2016-02-16 NOTE — Progress Notes (Signed)
UR chart review completed.  

## 2016-02-16 NOTE — Lactation Note (Signed)
This note was copied from a baby's chart. Lactation Consultation Note  Patient Name: Michelle Shah Today's Date: 02/16/2016 Reason for consult: Initial assessment   Initial consult with first time mom of 6 hour old infant. Infant with 2 BF for 10-15 minutes, 1 void and 1 stool since birth. Infant birth weight 7 lb 7 oz.   Mom reports she feels BF is going well. Infant is asleep in dad's arms currently. Mom reports she has been shown how to hand express and that she is able to see colostrum when she is trying to get infant latched. Enc mom to hand express prior to feeding or to hand express into spoon if infant not latching and spoon feed infant to encourage him to latch. Mom denies nipple pain with latch, enc EBM to nipples post BF as needed.  LC Brochure and BF Resources handout given, informed mom of IP/OP Services, BF Support Groups and LC phone #. Enc mom to call desk for assistance with feeding as needed. Mom voiced understanding.   Reviewed positioning with mom, she reports she likes to use the football hold. Referred her to BF information in Taking Care of Baby and Me Booklet.   Mom without questions at this time. Follow up tomorrow and prn.    Maternal Data Formula Feeding for Exclusion: No Has patient been taught Hand Expression?: Yes Does the patient have breastfeeding experience prior to this delivery?: No  Feeding Feeding Type: Breast Fed  LATCH Score/Interventions                      Lactation Tools Discussed/Used WIC Program: Yes   Consult Status Consult Status: Follow-up Date: 02/17/16 Follow-up type: In-patient    Michelle Shah 02/16/2016, 2:17 PM

## 2016-02-16 NOTE — Anesthesia Postprocedure Evaluation (Signed)
Anesthesia Post Note  Patient: Michelle Shah  Procedure(s) Performed: * No procedures listed *  Patient location during evaluation: Mother Baby Anesthesia Type: Epidural Level of consciousness: awake and alert and oriented Pain management: satisfactory to patient Vital Signs Assessment: post-procedure vital signs reviewed and stable Respiratory status: spontaneous breathing and nonlabored ventilation Cardiovascular status: stable Postop Assessment: no headache, no backache, no signs of nausea or vomiting, adequate PO intake and patient able to bend at knees (patient up walking) Anesthetic complications: no     Last Vitals:  Vitals:   02/16/16 0945 02/16/16 1050  BP: (!) 127/54 (!) 146/60  Pulse: 69 78  Resp: 18 20  Temp: 36.7 C 36.6 C    Last Pain:  Vitals:   02/16/16 1050  TempSrc: Oral  PainSc: 6    Pain Goal:                 Monquie Fulgham

## 2016-02-17 NOTE — Progress Notes (Addendum)
Post Partum Day PPD 1 s/p VAVD Subjective: no complaints, up ad lib, voiding and tolerating PO.  Objective: Blood pressure 137/61, pulse 79, temperature 98.2 F (36.8 C), resp. rate 18, height 5' (1.524 m), weight 217 lb (98.4 kg), last menstrual period 05/25/2015, SpO2 100 %, unknown if currently breastfeeding.  Physical Exam:  General: alert, cooperative and no distress Lochia: appropriate Uterine Fundus: firm Incision: NA DVT Evaluation: No evidence of DVT seen on physical exam.   Recent Labs  02/15/16 1325  HGB 9.3*  HCT 28.4*    Assessment/Plan: Plan for discharge tomorrow and Breastfeeding . Working with lactation and nurses to support breastfeeding. Considering IUD but hasn't made a final decision yet.   LOS: 2 days   Andres Ege, MD, PGY-1, MPH 02/17/2016, 7:14 AM   OB FELLOW POSTPARTUM PROGRESS NOTE ATTESTATION  I have seen and examined this patient and agree with above documentation in the resident's note.   Ernestina Penna, MD 8:31 AM

## 2016-02-18 MED ORDER — FERROUS SULFATE 325 (65 FE) MG PO TABS: 325.0000 mg | ORAL_TABLET | Freq: Every day | ORAL | 0 refills | Status: AC

## 2016-02-18 MED ORDER — IBUPROFEN 600 MG PO TABS: 600.0000 mg | ORAL_TABLET | Freq: Four times a day (QID) | ORAL | 0 refills | Status: AC

## 2016-02-18 NOTE — Discharge Instructions (Signed)
Vaginal Delivery, Care After °Refer to this sheet in the next few weeks. These discharge instructions provide you with information on caring for yourself after delivery. Your health care provider may also give you specific instructions. Your treatment has been planned according to the most current medical practices available, but problems sometimes occur. Call your health care provider if you have any problems or questions after you go home. °HOME CARE INSTRUCTIONS °· Take over-the-counter or prescription medicines only as directed by your health care provider or pharmacist. °· Do not drink alcohol, especially if you are breastfeeding or taking medicine to relieve pain. °· Do not chew or smoke tobacco. °· Do not use illegal drugs. °· Continue to use good perineal care. Good perineal care includes: °¨ Wiping your perineum from front to back. °¨ Keeping your perineum clean. °· Do not use tampons or douche until your health care provider says it is okay. °· Shower, wash your hair, and take tub baths as directed by your health care provider. °· Wear a well-fitting bra that provides breast support. °· Eat healthy foods. °· Drink enough fluids to keep your urine clear or pale yellow. °· Eat high-fiber foods such as whole grain cereals and breads, brown rice, beans, and fresh fruits and vegetables every day. These foods may help prevent or relieve constipation. °· Follow your health care provider's recommendations regarding resumption of activities such as climbing stairs, driving, lifting, exercising, or traveling. °· Talk to your health care provider about resuming sexual activities. Resumption of sexual activities is dependent upon your risk of infection, your rate of healing, and your comfort and desire to resume sexual activity. °· Try to have someone help you with your household activities and your newborn for at least a few days after you leave the hospital. °· Rest as much as possible. Try to rest or take a nap  when your newborn is sleeping. °· Increase your activities gradually. °· Keep all of your scheduled postpartum appointments. It is very important to keep your scheduled follow-up appointments. At these appointments, your health care provider will be checking to make sure that you are healing physically and emotionally. °SEEK MEDICAL CARE IF:  °· You are passing large clots from your vagina. Save any clots to show your health care provider. °· You have a foul smelling discharge from your vagina. °· You have trouble urinating. °· You are urinating frequently. °· You have pain when you urinate. °· You have a change in your bowel movements. °· You have increasing redness, pain, or swelling near your vaginal incision (episiotomy) or vaginal tear. °· You have pus draining from your episiotomy or vaginal tear. °· Your episiotomy or vaginal tear is separating. °· You have painful, hard, or reddened breasts. °· You have a severe headache. °· You have blurred vision or see spots. °· You feel sad or depressed. °· You have thoughts of hurting yourself or your newborn. °· You have questions about your care, the care of your newborn, or medicines. °· You are dizzy or light-headed. °· You have a rash. °· You have nausea or vomiting. °· You were breastfeeding and have not had a menstrual period within 12 weeks after you stopped breastfeeding. °· You are not breastfeeding and have not had a menstrual period by the 12th week after delivery. °· You have a fever. °SEEK IMMEDIATE MEDICAL CARE IF:  °· You have persistent pain. °· You have chest pain. °· You have shortness of breath. °· You faint. °· You   have leg pain. °· You have stomach pain. °· Your vaginal bleeding saturates two or more sanitary pads in 1 hour. °  °This information is not intended to replace advice given to you by your health care provider. Make sure you discuss any questions you have with your health care provider. °  °Document Released: 07/02/2000 Document Revised:  03/26/2015 Document Reviewed: 03/01/2012 °Elsevier Interactive Patient Education ©2016 [employer] ° °Breastfeeding Challenges and Solutions °Even though breastfeeding is natural, it can be challenging, especially in the first few weeks after childbirth. It is normal for problems to arise when starting to breastfeed your new baby, even if you have breastfed before. This document provides some solutions to the most common breastfeeding challenges.  °CHALLENGES AND SOLUTIONS °Challenge--Cracked or Sore Nipples °Cracked or sore nipples are commonly experienced by breastfeeding mothers. Cracked or sore nipples often are caused by inadequate latching (when your baby's mouth attaches to your breast to breastfeed). Soreness can also happen if your baby is not positioned properly at your breast. Although nipple cracking and soreness are common during the first week after birth, nipple pain is never normal. If you experience nipple cracking or soreness that lasts longer than 1 week or nipple pain, call your health care provider or lactation consultant.  °Solution °Ensure proper latching and positioning of your baby by following the steps below: °· Find a comfortable place to sit or lie down, with your neck and back well supported. °· Place a pillow or rolled up blanket under your baby to bring him or her to the level of your breast (if you are seated). °· Make sure that your baby's abdomen is facing your abdomen. °· Gently massage your breast. With your fingertips, massage from your chest wall toward your nipple in a circular motion. This encourages milk flow. You may need to continue this action during the feeding if your milk flows slowly. °· Support your breast with 4 fingers underneath and your thumb above your nipple. Make sure your fingers are well away from your nipple and your baby's mouth. °· Stroke your baby's lips gently with your finger or nipple. °· When your baby's mouth is open wide enough, quickly bring  your baby to your breast, placing your entire nipple and as much of the colored area around your nipple (areola) as possible into your baby's mouth. °¨ More areola should be visible above your baby's upper lip than below the lower lip. °¨ Your baby's tongue should be between his or her lower gum and your breast. °· Ensure that your baby's mouth is correctly positioned around your nipple (latched). Your baby's lips should create a seal on your breast and be turned out (everted). °· It is common for your baby to suck for about 2-3 minutes in order to start the flow of breast milk. °Signs that your baby has successfully latched on to your nipple include:  °· Quietly tugging or quietly sucking without causing you pain.   °· Swallowing heard between every 3-4 sucks.   °· Muscle movement above and in front of his or her ears with sucking.   °Signs that your baby has not successfully latched on to nipple include:  °· Sucking sounds or smacking sounds from your baby while nursing.   °· Nipple pain.   °Ensure that your breasts stay moisturized and healthy by: °· Avoiding the use of soap on your nipples.   °· Wearing a supportive bra. Avoid wearing underwire-style bras or tight bras.   °· Air drying your nipples for 3-4 minutes   after each feeding.   °· Using only cotton bra pads to absorb breast milk leakage. Leaking of breast milk between feedings is normal.  Be sure to change the pads if they become soaked with milk. °· Using lanolin on your nipples after nursing. Lanolin helps to maintain your skin's normal moisture barrier. If you use pure lanolin you do not need to wash it off before feeding your baby again. Pure lanolin is not toxic to your baby.  You may also hand express a few drops of breast milk and gently massage that milk into your nipples, allowing it to air dry. °Challenge--Breast Engorgement °Breast engorgement is the overfilling of your breasts with breast milk. In the first few weeks after giving birth, you  may experience breast engorgement. Breast engorgement can make your breasts throb and feel hard, tightly stretched, warm, and tender. Engorgement peaks about the fifth day after you give birth. Having breast engorgement does not mean you have to stop breastfeeding your baby. °Solution °· Breastfeed when you feel the need to reduce the fullness of your breasts or when your baby shows signs of hunger. This is called "breastfeeding on demand." °· Newborns (babies younger than 4 weeks) often breastfeed every 1-3 hours during the day. You may need to awaken your baby to feed if he or she is asleep at a feeding time. °· Do not allow your baby to sleep longer than 5 hours during the night without a feeding. °· Pump or hand express breast milk before breastfeeding to soften your breast, areola, and nipple. °· Apply warm, moist heat (in the shower or with warm water-soaked hand towels) just before feeding or pumping, or massage your breast before or during breastfeeding. This increases circulation and helps your milk to flow. °· Completely empty your breasts when breastfeeding or pumping. Afterward, wear a snug bra (nursing or regular) or tank top for 1-2 days to signal your body to slightly decrease milk production. Only wear snug bras or tank tops to treat engorgement. Tight bras typically should be avoided by breastfeeding mothers. Once engorgement is relieved, return to wearing regular, loose-fitting clothes. °· Apply ice packs to your breasts to lessen the pain from engorgement and relieve swelling, unless the ice is uncomfortable for you. °· Do not delay feedings. Try to relax when it is time to feed your baby. This helps to trigger your "let-down reflex," which releases milk from your breast. °· Ensure your baby is latched on to your breast and positioned properly while breastfeeding. °· Allow your baby to remain at your breast as long as he or she is latched on well and actively sucking. Your baby will let you know  when he or she is done breastfeeding by pulling away from your breast or falling asleep. °· Avoid introducing bottles or pacifiers to your baby in the early weeks of breastfeeding. Wait to introduce these things until after resolving any breastfeeding challenges. °· Try to pump your milk on the same schedule as when your baby would breastfeed if you are returning to work or away from home for an extended period. °· Drink plenty of fluids to avoid dehydration, which can eventually put you at greater risk of breast engorgement. °If you follow these suggestions, your engorgement should improve in 24-48 hours. If you are still experiencing difficulty, call your lactation consultant or health care provider.  °Challenge--Plugged Milk Ducts °Plugged milk ducts occur when the duct does not drain milk effectively and becomes swollen. Wearing a tight-fitting nursing bra or having   difficulty with latching may cause plugged milk ducts. Not drinking enough water (8-10 c [1.9-2.4 L] per day) can contribute to plugged milk ducts. Once a duct has become plugged, hard lumps, soreness, and redness may develop in your breast.  °Solution °Do not delay feedings. Feed your baby frequently and try to empty your breasts of milk at each feeding. Try breastfeeding from the affected side first so there is a better chance that the milk will drain completely from that breast. Apply warm, moist towels to your breasts for 5-10 minutes before feeding. Alternatively, a hot shower right before breastfeeding can provide the moist heat that can encourage milk flow. Gentle massage of the sore area before and during a feeding may also help. Avoid wearing tight clothing or bras that put pressure on your breasts. Wear bras that offer good support to your breasts, but avoid underwire bras. If you have a plugged milk duct and develop a fever, you need to see your health care provider.  °Challenge--Mastitis °Mastitis is inflammation of your breast. It  usually is caused by a bacterial infection and can cause flu-like symptoms. You may develop redness in your breast and a fever. Often when mastitis occurs, your breast becomes firm, warm, and very painful. The most common causes of mastitis are poor latching, ineffective sucking from your baby, consistent pressure on your breast (possibly from wearing a tight-fitting bra or shirt that restricts the milk flow), unusual stress or fatigue, or missed feedings.  °Solution °You will be given antibiotic medicine to treat the infection. It is still important to breastfeed frequently to empty your breasts. Continuing to breastfeed while you recover from mastitis will not harm your baby. Make sure your baby is positioned properly during every feeding. Apply moist heat to your breasts for a few minutes before feeding to help the milk flow and to help your breasts empty more easily. °Challenge--Thrush °Thrush is a yeast infection that can form on your nipples, in your breast, or in your baby's mouth. It causes itching, soreness, burning or stabbing pain, and sometimes a rash.  °Solution °You will be given a medicated ointment for your nipples, and your baby will be given a liquid medicine for his or her mouth. It is important that you and your baby are treated at the same time because thrush can be passed between you and your baby. Change disposable nursing pads often. Any bras, towels, or clothing that come in contact with infected areas of your body or your baby's body need to be washed in very hot water every day. Wash your hands and your baby's hands often. All pacifiers, bottle nipples, or toys your baby puts in his or her mouth should be boiled once a day for 20 minutes. After 1 week of treatment, discard pacifiers and bottle nipples and buy new ones. All breast pump parts that touch the milk need to be boiled for 20 minutes every day. °Challenge--Low Milk Supply °You may not be producing enough milk if your baby is not  gaining the proper amount of weight. Breast milk production is based on a supply-and-demand system. Your milk supply depends on how frequently and effectively your baby empties your breast.  °Solution °The more you breastfeed and pump, the more breast milk you will produce. It is important that your baby empties at least one of your breasts at each feeding. If this is not happening, then use a breast pump or hand express any milk that remains. This will help to drain as   much milk as possible at each feeding. It will also signal your body to produce more milk. If your baby is not emptying your breasts, it may be due to latching, sucking, or positioning problems. If low milk supply continues after addressing these issues, contact your health care provider or a lactation specialist as soon as possible. °Challenge--Inverted or Flat Nipples °Some women have nipples that turn inward instead of protruding outward. Other women have nipples that are flat. Inverted or flat nipples can sometimes make it more difficult for your baby to latch onto your breast. °Solution °You may be given a small device that pulls out inverted nipples. This device should be applied right before your baby is brought to your breast. You can also try using a breast pump for a short time before placing the baby at your breast. The pump can pull your nipple outwards to help your infant latch more easily. The baby's sucking motion will help the inverted nipple protrude as well.  °If you have flat nipples, encourage your baby to latch onto your breast and feed frequently in the early days after birth. This will give your baby practice latching on correctly while your breast is still soft. When your milk supply increases, between the second and fifth day after birth and your breasts become full, your baby will have an easier time latching.  °Contact a lactation consultant if you still have concerns. She or he can teach you additional techniques to  address breastfeeding problems related to nipple shape and position.  °FOR MORE INFORMATION °[employer]: www.llli.org °  °This information is not intended to replace advice given to you by your health care provider. Make sure you discuss any questions you have with your health care provider. °  °Document Released: 12/27/2005 Document Revised: 07/26/2014 Document Reviewed: 12/29/2012 °Elsevier Interactive Patient Education ©2016 Elsevier Inc. ° °

## 2016-02-18 NOTE — Discharge Summary (Signed)
OB Discharge Summary     Patient Name: Michelle Shah DOB: 05-08-1991 MRN: [id number]  Date of admission: 02/15/2016 Delivering MD: Wyvonnia Dusky D   Date of discharge: 02/18/2016  Admitting diagnosis: 55 WKS, WATER BROKE, LABOR Intrauterine pregnancy: [redacted]w[redacted]d     Secondary diagnosis:  Active Problems:   Indication for care or intervention related to labor and delivery  Additional problems: None     Discharge diagnosis: Term Pregnancy Delivered                                                                                                Post partum procedures:None  Augmentation: Pitocin and Cytotec  Complications: None  Hospital course:  Onset of Labor With Vacuum-assisted Vaginal Delivery for decels 25 y.o. yo G1P1001 at [redacted]w[redacted]d was admitted in Latent Labor on 02/15/2016. SROM. Patient had an uncomplicated labor course as follows:  Membrane Rupture Time/Date: 4:16 AM ,02/15/2016   Intrapartum Procedures: Episiotomy: None [1]                                         Lacerations:  2nd degree [3]  Patient had a delivery of a Viable infant. 02/16/2016  Information for the patient's newborn:  Pavlovic, Boy Ezri [[id number]]  Delivery Method: Vag-vac assisted    Pateint had an uncomplicated postpartum course.  She is ambulating, tolerating a regular diet, passing flatus, and urinating well. Patient is discharged home in stable condition on 02/18/16.  Concerned about milk supply. LC had pt supplement w/ formula last night. Baby had BM since then.     Physical exam Vitals:   02/17/16 0024 02/17/16 0555 02/17/16 1826 02/18/16 0557  BP: 127/84 137/61 129/71 (!) 105/34  Pulse: 76 79 90 72  Resp: Temp: 98.8 F (37.1 C) 98.2 F (36.8 C) 98.4 F (36.9 C) 98.3 F (36.8 C)  TempSrc: Oral  Oral   SpO2:      Weight:      Height:       General: alert, cooperative and no distress Lochia: appropriate Uterine Fundus: firm Incision: N/A DVT Evaluation: Negative Homan's  sign. Labs: Lab Results  Component Value Date   WBC 12.0 (H) 02/15/2016   HGB 9.3 (L) 02/15/2016   HCT 28.4 (L) 02/15/2016   MCV 77.6 (L) 02/15/2016   PLT 232 02/15/2016   CMP Latest Ref Rng & Units 07/30/2015  Glucose 65 - 99 mg/dL 85  BUN 6 - 20 mg/dL 6  Creatinine 7.84 - 6.96 mg/dL 2.95  Sodium 284 - 132 mmol/L 135  Potassium 3.5 - 5.1 mmol/L 3.8  Chloride 101 - 111 mmol/L 107  CO2 22 - 32 mmol/L 20(L)  Calcium 8.9 - 10.3 mg/dL 8.9    Discharge instruction: per After Visit Summary and "Baby and Me Booklet".  After visit meds:    Medication List    STOP taking these medications   ondansetron 4 MG disintegrating tablet Commonly known as:  ZOFRAN ODT  TAKE these medications   acetaminophen 500 MG tablet Commonly known as:  TYLENOL Take 1,000 mg by mouth every 6 (six) hours as needed for mild pain, moderate pain or headache.   [employer] PRENATAL GUMMY PO Take 2 each by mouth daily.   ferrous sulfate 325 (65 FE) MG tablet Commonly known as:  FERROUSUL Take 1 tablet (325 mg total) by mouth daily with breakfast.   ibuprofen 600 MG tablet Commonly known as:  ADVIL,MOTRIN Take 1 tablet (600 mg total) by mouth every 6 (six) hours.       Diet: routine diet  Activity: Advance as tolerated. Pelvic rest for 6 weeks.   Outpatient follow up:6 weeks Follow up Appt:No future appointments. Follow up Visit: Follow-up Information    [facility] in 6 week(s).   Specialty:  Obstetrics and Gynecology Why:  Postpartum appointment Contact information: [address] [place] [address] [phone]       [facility] ADMISS[facility] .   Why:  as needed in emergencies Contact information: [address] [id number] m[place] [address] [phone]         Recommend LC appt OP Postpartum contraception: IUD Mirena  Newborn Data: Live born female  Birth Weight: 7 lb  7 oz (3374 g) APGAR: 7, 9  Baby Feeding: Breast Disposition:home with mother   02/18/2016 Dorathy Kinsman, CNM

## 2016-02-18 NOTE — Lactation Note (Signed)
This note was copied from a baby's chart. Lactation Consultation Note  Patient Name: Michelle Shah Today's Date: 02/18/2016 Reason for consult: Follow-up assessment  I assisted Mom w/improving infant's body position at breast, which she said was more comfortable. Specifics of an asymmetric latch shown via [employer]. Parents were taught signs/sound of swallowing. Parents to continue supplementing after breastfeeding for the time being.   Mom has a hand pump at home. She may choose to begin using her hand pump to see if she can obtain EBM for supplementation (and then make up the difference with the formula).   Mom has some bruising on her R nipple. Mom has our # to call if she has any questions or concerns.  Lurline Hare [facility] 02/18/2016, 11:41 AM

## 2016-02-19 ENCOUNTER — Telehealth (HOSPITAL_COMMUNITY): Payer: Self-pay | Admitting: Lactation Services

## 2016-02-19 NOTE — Telephone Encounter (Signed)
On callback mom reports that one breast is very sore. I talked with mom and she reports nipple is so sore she is not latching him onto that breast. States it feels like he is biting.  Reports breasts are feeling heavier. She is latching him to one breast but he has been fussy, like he is not getting enough. Last stool was last evening. Mom does not have pump- plans to borrow one from sister. Encouraged to get pump and pump q 2-3 hours to prevent engorgement and to feed EBM to baby. To see Ped tomorrow. Encouraged to rub EBM into nipples after nursing or pumping. No further questions at present. To call prn

## 2016-03-05 ENCOUNTER — Encounter: Payer: Self-pay | Admitting: *Deleted

## 2016-04-06 ENCOUNTER — Ambulatory Visit: Payer: Self-pay | Admitting: Certified Nurse Midwife
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
## 2017-02-17 NOTE — Telephone Encounter (Signed)
See note

## 2018-01-09 ENCOUNTER — Encounter: Payer: Self-pay | Admitting: Emergency Medicine

## 2018-01-09 ENCOUNTER — Emergency Department (HOSPITAL_COMMUNITY)
Admission: EM | Admit: 2018-01-09 | Discharge: 2018-01-09 | Disposition: A | Discharge status: Home / Self Care | Payer: Self-pay | Attending: Physician Assistant | Admitting: Physician Assistant

## 2018-01-09 DIAGNOSIS — K0889 Other specified disorders of teeth and supporting structures: Secondary | ICD-10-CM | POA: Insufficient documentation

## 2018-01-09 DIAGNOSIS — J302 Other seasonal allergic rhinitis: Secondary | ICD-10-CM | POA: Insufficient documentation

## 2018-01-09 DIAGNOSIS — Z79899 Other long term (current) drug therapy: Secondary | ICD-10-CM | POA: Insufficient documentation

## 2018-01-09 DIAGNOSIS — Z87891 Personal history of nicotine dependence: Secondary | ICD-10-CM | POA: Insufficient documentation

## 2018-01-09 DIAGNOSIS — K029 Dental caries, unspecified: Secondary | ICD-10-CM

## 2018-01-09 MED ORDER — HYDROCODONE-ACETAMINOPHEN 5-325 MG PO TABS
1.0000 | ORAL_TABLET | Freq: Once | ORAL | Status: AC
  Administered 2018-01-09: 1 via ORAL
  Filled 2018-01-09: qty 1

## 2018-01-09 MED ORDER — AMOXICILLIN 500 MG PO CAPS: 500.0000 mg | ORAL_CAPSULE | Freq: Three times a day (TID) | ORAL | 0 refills | Status: DC

## 2018-01-09 MED ORDER — TRAMADOL HCL 50 MG PO TABS: 50.0000 mg | ORAL_TABLET | Freq: Four times a day (QID) | ORAL | 0 refills | Status: DC | PRN

## 2018-01-09 NOTE — Discharge Instructions (Signed)
[employer]  °[employer]  °[facility]  °[address]  °[place], [place] [address]  °Phone [phone]  °[facility] [employer] [facility] [place], [place], exemplifies the Dental School’s vision to improve the health and quality of life of all [place] by creating leaders with a passion to care for the underserved and by leading the nation in community-based, service learning oral health education. °We are committed to offering comprehensive general dental services for adults, children and special needs patients in a safe, caring and professional setting. ° °Appointments: Our clinic is open Monday through Friday 8:00 a.m. until 5:00 p.m. The amount of time scheduled for an appointment depends on the patient’s specific needs. We ask that you keep your appointed time for care or provide 24-hour notice of all appointment changes. Parents or legal guardians must accompany [facility] children. ° °Payment for Services: Medicaid and other insurance plans are welcome. Payment for services is due when services are rendered and may be made by cash or credit card. If you have dental insurance, we will assist you with your claim submission.  ° °Emergencies: Emergency services will be provided Monday through Friday on a walk-in basis. Please arrive early for emergency services. After hours emergency services will be provided for patients of record as required. ° °Services:  °Comprehensive General Dentistry  °Children’s Dentistry  °Oral Surgery - Extractions  °Root Canals  °Sealants and Tooth Colored Fillings  °Crowns and Bridges  °Dentures and Partial Dentures  °Implant Services  °Periodontal Services and Cleanings  °Cosmetic Tooth Whitening  °Digital Radiography  °3-D/Cone Beam Imaging ° ° °

## 2018-01-09 NOTE — ED Provider Notes (Signed)
[facility] EMERGENCY DEPARTMENT Provider Note   CSN: [id number] Arrival date & time: 01/09/18  1113     History   Chief Complaint Chief Complaint  Patient presents with  . Dental Pain    HPI Michelle Shah is a 27 y.o. female is here with chief complaint of toe pain.  Patient states that she has had on and off pain for in the left bottom tooth for several weeks.  She states she think she has a cavity there.  For the past 20 hours she has had severe pain, unable to sleep at night, tearful, pain radiates into the jaw and ear.  She denies fevers or chills.  She denies difficulty swallowing.  She is having difficulty eating on that side.  HPI  Past Medical History:  Diagnosis Date  . Anemia   . Anxiety   . Depression   . Seasonal allergies     Patient Active Problem List   Diagnosis Date Noted  . Indication for care or intervention related to labor and delivery 02/15/2016  . Supervision of normal first pregnancy, antepartum 09/04/2015    Past Surgical History:  Procedure Laterality Date  . NO PAST SURGERIES       OB History    Gravida  1   Para  1   Term  1   Preterm      AB      Living  1     SAB      TAB      Ectopic      Multiple  0   Live Births  1            Home Medications    Prior to Admission medications   Medication Sig Start Date End Date Taking? Authorizing Provider  acetaminophen (TYLENOL) 500 MG tablet Take 1,000 mg by mouth every 6 (six) hours as needed for mild pain, moderate pain or headache.    [provider]  ferrous sulfate (FERROUSUL) 325 (65 FE) MG tablet Take 1 tablet (325 mg total) by mouth daily with breakfast. 02/18/16   Katrinka Blazing, [place], CNM  ibuprofen (ADVIL,MOTRIN) 600 MG tablet Take 1 tablet (600 mg total) by mouth every 6 (six) hours. 02/18/16   Katrinka Blazing, [place], CNM  Prenatal Vit-Min-FA-Fish Oil ([facility] PRENATAL GUMMY PO) Take 2 each by mouth daily.    [provider]    Family  History Family History  Problem Relation Age of Onset  . Diabetes Father   . Diabetes Paternal Aunt   . Diabetes Paternal Uncle     Social History Social History   Tobacco Use  . Smoking status: Former Smoker    Last attempt to quit: 08/20/2015    Years since quitting: 2.3  . Smokeless tobacco: Never Used  Substance Use Topics  . Alcohol use: No    Comment: weekly  . Drug use: No     Allergies   Patient has no known allergies.   Review of Systems Review of Systems  Positive for dental pain, negative for voice change, difficulty swallowing or fevers Physical Exam Updated Vital Signs BP (!) 144/102 (BP Location: Right Arm)   Pulse 88   Temp 98.3 F (36.8 C) (Oral)   Resp 20   LMP 12/14/2017 (Exact Date)   SpO2 100%   Physical Exam  Physical Exam  Nursing note and vitals reviewed. Constitutional: She is oriented to person, place, and time. She appears well-developed and well-nourished. No distress.  HENT:  Head: Normocephalic and atraumatic.  Eyes: Conjunctivae normal and EOM are normal. Pupils are equal, round, and reactive to light. No scleral icterus.  Mouth: Left second lower molar with dental carry on the buccal surface.  There is a fine cracked through the tooth, tender to palpation and tender palpation in the buccal gingiva. Neck: Normal range of motion.  Cardiovascular: Normal rate, regular rhythm and normal heart sounds.  Exam reveals no gallop and no friction rub.   No murmur heard. Pulmonary/Chest: Effort normal and breath sounds normal. No respiratory distress.  Abdominal: Soft. Bowel sounds are normal. She exhibits no distension and no mass. There is no tenderness. There is no guarding.  Neurological: She is alert and oriented to person, place, and time.  Skin: Skin is warm and dry. She is not diaphoretic.    ED Treatments / Results  Labs (all labs ordered are listed, but only abnormal results are displayed) Labs Reviewed - No data to  display  EKG None  Radiology No results found.  Procedures Procedures (including critical care time)  Medications Ordered in ED Medications  HYDROcodone-acetaminophen (NORCO/VICODIN) 5-325 MG per tablet 1 tablet (1 tablet Oral Given 01/09/18 1309)     Initial Impression / Assessment and Plan / ED Course  I have reviewed the triage vital signs and the nursing notes.  Pertinent labs & imaging results that were available during my care of the patient were reviewed by me and considered in my medical decision making (see chart for details).     Patient with toothache.  No gross abscess.  Exam unconcerning for Ludwig's angina or spread of infection.  Will treat with penicillin and pain medicine.  Urged patient to follow-up with dentist.     Final Clinical Impressions(s) / ED Diagnoses   Final diagnoses:  None    ED Discharge Orders    None       Arthor CaptainHarris, Zamyiah Tino, PA-C 01/09/18 1455    Mackuen, Cindee Saltourteney Lyn, MD 01/09/18 1553

## 2018-05-11 ENCOUNTER — Encounter: Payer: Self-pay | Admitting: *Deleted

## 2018-05-21 ENCOUNTER — Emergency Department (HOSPITAL_COMMUNITY): Payer: Self-pay

## 2018-05-21 ENCOUNTER — Emergency Department (HOSPITAL_COMMUNITY)
Admission: EM | Admit: 2018-05-21 | Discharge: 2018-05-22 | Disposition: A | Discharge status: Home / Self Care | Payer: Self-pay | Attending: Emergency Medicine | Admitting: Emergency Medicine

## 2018-05-21 ENCOUNTER — Other Ambulatory Visit: Payer: Self-pay

## 2018-05-21 DIAGNOSIS — J0110 Acute frontal sinusitis, unspecified: Secondary | ICD-10-CM | POA: Insufficient documentation

## 2018-05-21 DIAGNOSIS — Z79899 Other long term (current) drug therapy: Secondary | ICD-10-CM | POA: Insufficient documentation

## 2018-05-21 DIAGNOSIS — Z87891 Personal history of nicotine dependence: Secondary | ICD-10-CM | POA: Insufficient documentation

## 2018-05-21 DIAGNOSIS — J069 Acute upper respiratory infection, unspecified: Secondary | ICD-10-CM | POA: Insufficient documentation

## 2018-05-21 LAB — GROUP A STREP BY PCR: GROUP A STREP BY PCR: NOT DETECTED

## 2018-05-21 MED ORDER — KETOROLAC TROMETHAMINE 15 MG/ML IJ SOLN
15.0000 mg | Freq: Once | INTRAMUSCULAR | Status: AC
  Administered 2018-05-22: 15 mg via INTRAMUSCULAR
  Filled 2018-05-21: qty 1

## 2018-05-21 NOTE — ED Triage Notes (Signed)
Patient c/o cough/sore throat x 2 weeks. Patient is audibly congested.

## 2018-05-22 MED ORDER — BENZONATATE 100 MG PO CAPS: 100.0000 mg | ORAL_CAPSULE | Freq: Three times a day (TID) | ORAL | 0 refills | Status: AC

## 2018-05-22 MED ORDER — PREDNISONE 10 MG PO TABS: 40.0000 mg | ORAL_TABLET | Freq: Every day | ORAL | 0 refills | Status: AC

## 2018-05-22 MED ORDER — DICLOFENAC SODIUM 1 % TD GEL: 2.0000 g | Freq: Four times a day (QID) | TRANSDERMAL | 0 refills | Status: AC

## 2018-05-22 MED ORDER — FLUTICASONE PROPIONATE 50 MCG/ACT NA SUSP: 2.0000 | Freq: Every day | NASAL | 0 refills | Status: AC

## 2018-05-22 MED ORDER — AMOXICILLIN 500 MG PO CAPS: 500.0000 mg | ORAL_CAPSULE | Freq: Three times a day (TID) | ORAL | 0 refills | Status: AC

## 2018-05-22 NOTE — Discharge Instructions (Signed)
Take antibiotics as prescribed.  Take the entire course, even if your symptoms improve. Take prednisone as prescribed. Use Tessalon to help decrease coughing.  Use Flonase to help with nasal congestion and postnasal drip. Use Voltaren gel to help with pain of your chest.  You may also take Tylenol. Make sure you stay well-hydrated with water. Wash your hands frequently to prevent spread of infection. Follow-up in the ER or with urgent care as needed. Return to the emergency room if you develop any new, concerning, or worsening symptoms.

## 2018-05-22 NOTE — ED Provider Notes (Addendum)
[facility] EMERGENCY DEPARTMENT Provider Note   CSN: [id number] Arrival date & time: 05/21/18  2143     History   Chief Complaint Chief Complaint  Patient presents with  . Sore Throat  . Cough  . URI    HPI Michelle Shah is a 27 y.o. female presenting for evaluation of nasal congestion, sore throat, cough, chest tightness.  Patient states for the past 2 weeks, she has been feeling poorly.  She reports persistent nasal congestion with tenderness of the right frontal sinus.  She had ear pain yesterday, this resolved.  She has a mild sore throat, worse when she coughs.  She reports chest tightness.  Her cough is dry.  She reports intermittent subjective fevers.  Pt has CP when she coughs surrounding her sternum and over her ribs. She denies nausea, vomiting, abdominal pain, urinary symptoms, normal bowel movements.  She smokes cigarettes daily, but has not been smoking since her symptoms began.  She denies sick contacts.  She denies history of asthma or COPD.  She has no medical problems, takes no medications daily.  She has been taking Mucinex with mild improvement of her symptoms.  She has also tried cough drops, honey, tea, and water.  She has not taken any Tylenol or ibuprofen.  HPI  Past Medical History:  Diagnosis Date  . Anemia   . Anxiety   . Depression   . Seasonal allergies     Patient Active Problem List   Diagnosis Date Noted  . Indication for care or intervention related to labor and delivery 02/15/2016  . Supervision of normal first pregnancy, antepartum 09/04/2015    Past Surgical History:  Procedure Laterality Date  . NO PAST SURGERIES       OB History    Gravida  1   Para  1   Term  1   Preterm      AB      Living  1     SAB      TAB      Ectopic      Multiple  0   Live Births  1            Home Medications    Prior to Admission medications   Medication Sig Start Date End Date Taking? Authorizing Provider    acetaminophen (TYLENOL) 500 MG tablet Take 1,000 mg by mouth every 6 (six) hours as needed for mild pain, moderate pain or headache.    [provider]  amoxicillin (AMOXIL) 500 MG capsule Take 1 capsule (500 mg total) by mouth 3 (three) times daily for 10 days. 05/22/18 06/01/18  Pasco Marchitto, PA-C  benzonatate (TESSALON) 100 MG capsule Take 1 capsule (100 mg total) by mouth every 8 (eight) hours. 05/22/18   Ahlivia Salahuddin, PA-C  diclofenac sodium (VOLTAREN) 1 % GEL Apply 2 g topically 4 (four) times daily. 05/22/18   Crystalee Ventress, PA-C  ferrous sulfate (FERROUSUL) 325 (65 FE) MG tablet Take 1 tablet (325 mg total) by mouth daily with breakfast. 02/18/16   Katrinka Blazing, [place], CNM  fluticasone [facility]) 50 MCG/ACT nasal spray Place 2 sprays into both nostrils daily. 05/22/18   Andrian Urbach, PA-C  ibuprofen (ADVIL,MOTRIN) 600 MG tablet Take 1 tablet (600 mg total) by mouth every 6 (six) hours. 02/18/16   Katrinka Blazing, [place], CNM  predniSONE (DELTASONE) 10 MG tablet Take 4 tablets (40 mg total) by mouth daily for 5 days. 05/22/18 05/27/18  Alveria Apley, PA-C  Prenatal  Vit-Min-FA-Fish Oil ([facility] PRENATAL GUMMY PO) Take 2 each by mouth daily.    [provider]  traMADol (ULTRAM) 50 MG tablet Take 1 tablet (50 mg total) by mouth every 6 (six) hours as needed. 01/09/18   Arthor Captain, PA-C    Family History Family History  Problem Relation Age of Onset  . Diabetes Father   . Diabetes Paternal Aunt   . Diabetes Paternal Uncle     Social History Social History   Tobacco Use  . Smoking status: Former Smoker    Last attempt to quit: 08/20/2015    Years since quitting: 2.7  . Smokeless tobacco: Never Used  Substance Use Topics  . Alcohol use: No    Comment: weekly  . Drug use: No     Allergies   Patient has no known allergies.   Review of Systems Review of Systems  Constitutional: Positive for fever (subjective).  HENT: Positive for congestion, ear pain  (resolved), sinus pressure, sinus pain and sore throat.   Respiratory: Positive for cough and chest tightness.   Cardiovascular: Positive for chest pain (with coughing and palpation).  All other systems reviewed and are negative.    Physical Exam Updated Vital Signs BP 136/82   Pulse (!) 102   Temp 98.5 F (36.9 C) (Oral)   Resp 20   Ht 5' (1.524 m)   Wt 81.6 kg   LMP 05/05/2018   SpO2 96%   BMI 35.15 kg/m   Physical Exam  Constitutional: She is oriented to person, place, and time. She appears well-developed and well-nourished. No distress.  Patient appears uncomfortable, but no acute distress  HENT:  Head: Normocephalic and atraumatic.  Right Ear: Tympanic membrane, external ear and ear canal normal.  Left Ear: Tympanic membrane, external ear and ear canal normal.  Nose: Mucosal edema present. Right sinus exhibits frontal sinus tenderness. Right sinus exhibits no maxillary sinus tenderness. Left sinus exhibits no maxillary sinus tenderness and no frontal sinus tenderness.  Mouth/Throat: Uvula is midline, oropharynx is clear and moist and mucous membranes are normal. No tonsillar exudate.  Nasal mucosal edema.  Tenderness palpation of the right frontal sinus.  OP clear without tonsillar swelling or exudate.  Uvula midline with equal palate rise.  TMs nonerythematous and nonbulging bilaterally.  Eyes: Pupils are equal, round, and reactive to light. Conjunctivae and EOM are normal.  Neck: Normal range of motion.  Cardiovascular: Regular rhythm and intact distal pulses.  Mildly tachycardic around 100  Pulmonary/Chest: Effort normal and breath sounds normal. No respiratory distress. She has no decreased breath sounds. She has no wheezes. She has no rhonchi. She has no rales. She exhibits tenderness.  Pt speaking in full sentences.  Lung sounds in all fields tenderness palpation of bilateral sternal borders  Abdominal: Soft. She exhibits no distension. There is no tenderness.    Musculoskeletal: Normal range of motion.  Lymphadenopathy:    She has no cervical adenopathy.  Neurological: She is alert and oriented to person, place, and time.  Skin: Skin is warm. Capillary refill takes less than 2 seconds.  Psychiatric: She has a normal mood and affect.  Nursing note and vitals reviewed.    ED Treatments / Results  Labs (all labs ordered are listed, but only abnormal results are displayed) Labs Reviewed  GROUP A STREP BY PCR    EKG None   Radiology Dg Chest 2 View  Result Date: 05/21/2018 CLINICAL DATA:  Chest pain, shortness of breath, and cough for 5 days.  EXAM: CHEST - 2 VIEW COMPARISON:  08/08/2015 FINDINGS: The heart size and mediastinal contours are within normal limits. Both lungs are clear. The visualized skeletal structures are unremarkable. IMPRESSION: No active cardiopulmonary disease. Electronically Signed   By: Burman Nieves M.D.   On: 05/21/2018 22:13    Procedures Procedures (including critical care time)  Medications Ordered in ED Medications  ketorolac (TORADOL) 15 MG/ML injection 15 mg (15 mg Intramuscular Given 05/22/18 0015)     Initial Impression / Assessment and Plan / ED Course  I have reviewed the triage vital signs and the nursing notes.  Pertinent labs & imaging results that were available during my care of the patient were reviewed by me and considered in my medical decision making (see chart for details).     Patient presenting with 2 wk h/o URI symptoms.  Physical exam reassuring, patient is afebrile and appears nontoxic.  Pulmonary exam reassuring.  Doubt pneumonia, strep, or peritonsillar abscess. Strep negative. cxr viewed and interpreted by me, no pna, pnx, effusions, or cardiomegaly. EKG without signs of pericarditis or stemi. TTP of R frontal sinus, sxs appear to originate from the sinuses. Will tx for sinusitis with abx and flonase. Prednisone for inflammation. Voltaren gel for likely costochondritis. Pt to f/u as  needed. At this time, patient appears safe for discharge.  Return precautions given.  Patient states he understands and agrees to plan.  Final Clinical Impressions(s) / ED Diagnoses   Final diagnoses:  Acute frontal sinusitis, recurrence not specified  Upper respiratory tract infection, unspecified type    ED Discharge Orders         Ordered    predniSONE (DELTASONE) 10 MG tablet  Daily     05/22/18 0026    amoxicillin (AMOXIL) 500 MG capsule  3 times daily     05/22/18 0026    fluticasone (FLONASE) 50 MCG/ACT nasal spray  Daily     05/22/18 0026    benzonatate (TESSALON) 100 MG capsule  Every 8 hours     05/22/18 0026    diclofenac sodium (VOLTAREN) 1 % GEL  4 times daily     05/22/18 0026              Alveria Apley, PA-C 05/22/18 0052    Ward, Layla Maw, DO 05/22/18 [phone]

## 2018-09-04 ENCOUNTER — Encounter (HOSPITAL_COMMUNITY): Payer: Self-pay | Admitting: Emergency Medicine

## 2018-09-04 ENCOUNTER — Emergency Department (HOSPITAL_COMMUNITY)
Admission: EM | Admit: 2018-09-04 | Discharge: 2018-09-04 | Disposition: A | Discharge status: Home / Self Care | Payer: Self-pay | Attending: Emergency Medicine | Admitting: Emergency Medicine

## 2018-09-04 DIAGNOSIS — K0889 Other specified disorders of teeth and supporting structures: Secondary | ICD-10-CM | POA: Insufficient documentation

## 2018-09-04 MED ORDER — PENICILLIN V POTASSIUM 500 MG PO TABS: 500.0000 mg | ORAL_TABLET | Freq: Four times a day (QID) | ORAL | 0 refills | Status: AC

## 2018-09-04 MED ORDER — TRAMADOL HCL 50 MG PO TABS: 50.0000 mg | ORAL_TABLET | Freq: Four times a day (QID) | ORAL | 0 refills | Status: AC | PRN

## 2018-09-04 MED ORDER — KETOROLAC TROMETHAMINE 30 MG/ML IJ SOLN
30.0000 mg | Freq: Once | INTRAMUSCULAR | Status: AC
  Administered 2018-09-04: 30 mg via INTRAMUSCULAR
  Filled 2018-09-04: qty 1

## 2018-09-04 NOTE — ED Triage Notes (Signed)
Patient c/o L dental pain (upper and lower) x 1 week that woke her up at midnight - states it radiates to L ear and side of neck. She states she now has a migraine with photosensitivity. No relief with Tylenol. Patient tearful and restless in triage.

## 2018-09-04 NOTE — Discharge Instructions (Addendum)
Please read attached information. If you experience any new or worsening signs or symptoms please return to the emergency room for evaluation. Please follow-up with your primary care provider or specialist as discussed. Please use medication prescribed only as directed and discontinue taking if you have any concerning signs or symptoms.   °

## 2018-09-04 NOTE — ED Notes (Signed)
Patient verbalized understanding of discharge instructions and prescription medications and denies any further needs or questions at this time. VS stable. Patient ambulatory with steady gait.  

## 2018-09-04 NOTE — ED Provider Notes (Signed)
[facility] Provider Note   CSN: [id number] Arrival date & time: 09/04/18  [id number]     History   Chief Complaint Chief Complaint  Patient presents with  . Dental Pain  . Headache    HPI Michelle Shah is a 28 y.o. female.  HPI   28 year old female presents today with complaints of 1 month history of dental pain.  Patient notes pain in the left upper and lower jaw.  She notes subjective swelling.  She notes the pain has been severe the whole time, but last night was unable to sleep.  She notes taking Tylenol at home without improvement in symptoms.  She notes feeling hot, notes hyperventilating as she has been researching nerve damage online.  She notes she does not have a dentist.  She notes the dental pain is causing a headache presently with no neurological deficits.    Past Medical History:  Diagnosis Date  . Anemia   . Anxiety   . Depression   . Seasonal allergies     Patient Active Problem List   Diagnosis Date Noted  . Indication for care or intervention related to labor and delivery 02/15/2016  . Supervision of normal first pregnancy, antepartum 09/04/2015    Past Surgical History:  Procedure Laterality Date  . NO PAST SURGERIES       OB History    Gravida  1   Para  1   Term  1   Preterm      AB      Living  1     SAB      TAB      Ectopic      Multiple  0   Live Births  1            Home Medications    Prior to Admission medications   Medication Sig Start Date End Date Taking? Authorizing Provider  acetaminophen (TYLENOL) 500 MG tablet Take 1,000 mg by mouth every 6 (six) hours as needed for mild pain, moderate pain or headache.    [provider]  benzonatate (TESSALON) 100 MG capsule Take 1 capsule (100 mg total) by mouth every 8 (eight) hours. 05/22/18   Caccavale, Sophia, PA-C  diclofenac sodium (VOLTAREN) 1 % GEL Apply 2 g topically 4 (four) times daily. 05/22/18   Caccavale,  Sophia, PA-C  ferrous sulfate (FERROUSUL) 325 (65 FE) MG tablet Take 1 tablet (325 mg total) by mouth daily with breakfast. 02/18/16   Katrinka BlazingSmith, [place], CNM  fluticasone [facility](FLONASE) 50 MCG/ACT nasal spray Place 2 sprays into both nostrils daily. 05/22/18   Caccavale, Sophia, PA-C  ibuprofen (ADVIL,MOTRIN) 600 MG tablet Take 1 tablet (600 mg total) by mouth every 6 (six) hours. 02/18/16   Katrinka BlazingSmith, [place], CNM  penicillin v potassium (VEETID) 500 MG tablet Take 1 tablet (500 mg total) by mouth 4 (four) times daily for 7 days. 09/04/18 09/11/18  Eyvonne MechanicHedges, Kjirsten Bloodgood, PA-C  Prenatal Vit-Min-FA-Fish Oil ([employer] PRENATAL GUMMY PO) Take 2 each by mouth daily.    [provider]  traMADol (ULTRAM) 50 MG tablet Take 1 tablet (50 mg total) by mouth every 6 (six) hours as needed. 09/04/18   Eyvonne MechanicHedges, Seung Nidiffer, PA-C    Family History Family History  Problem Relation Age of Onset  . Diabetes Father   . Diabetes Paternal Aunt   . Diabetes Paternal Uncle     Social History Social History   Tobacco Use  . Smoking status: Former Smoker  Last attempt to quit: 08/20/2015    Years since quitting: 3.0  . Smokeless tobacco: Never Used  Substance Use Topics  . Alcohol use: No    Comment: weekly  . Drug use: No     Allergies   Patient has no known allergies.   Review of Systems Review of Systems  All other systems reviewed and are negative.    Physical Exam Updated Vital Signs BP 135/84 (BP Location: Right Arm)   Pulse 99   Temp 98.4 F (36.9 C) (Oral)   Resp 20   LMP 08/15/2018 (Approximate)   SpO2 97%   Physical Exam Vitals signs and nursing note reviewed.  Constitutional:      Appearance: She is well-developed.  HENT:     Head: Normocephalic and atraumatic.     Comments: Bilateral TMs normal, oropharynx clear with no erythema, dentition without significant decay, gumlines palpated with no fluctuance or edema, no point tenderness-lower the mouth soft neck is supple face is symmetric  Eyes:      General: No scleral icterus.       Right eye: No discharge.        Left eye: No discharge.     Conjunctiva/sclera: Conjunctivae normal.     Pupils: Pupils are equal, round, and reactive to light.  Neck:     Musculoskeletal: Normal range of motion.     Vascular: No JVD.     Trachea: No tracheal deviation.  Pulmonary:     Effort: Pulmonary effort is normal.     Breath sounds: No stridor.  Neurological:     Mental Status: She is alert and oriented to person, place, and time.     Coordination: Coordination normal.  Psychiatric:        Behavior: Behavior normal.        Thought Content: Thought content normal.        Judgment: Judgment normal.      ED Treatments / Results  Labs (all labs ordered are listed, but only abnormal results are displayed) Labs Reviewed - No data to display  EKG None  Radiology No results found.  Procedures Procedures (including critical care time)  Medications Ordered in ED Medications  ketorolac (TORADOL) 30 MG/ML injection 30 mg (has no administration in time range)     Initial Impression / Assessment and Plan / ED Course  I have reviewed the triage vital signs and the nursing notes.  Pertinent labs & imaging results that were available during my care of the patient were reviewed by me and considered in my medical decision making (see chart for details).     28 year old female presents today with dental pain.  She is tearful in the exam room, notes 1 month history of dental pain.  She has no objective signs of infection but given her complaints of swelling she will be started on antibiotics.  We do have a dentist on call she will be referred to on-call dentist.  She is given Ultram, penicillin and return precautions.  She verbalized understanding and agreement to today's plan.  Final Clinical Impressions(s) / ED Diagnoses   Final diagnoses:  Pain, dental    ED Discharge Orders         Ordered    penicillin v potassium (VEETID) 500 MG  tablet  4 times daily     09/04/18 0848    traMADol (ULTRAM) 50 MG tablet  Every 6 hours PRN     09/04/18 0848  Eyvonne Mechanic, PA-C 09/04/18 4097    Pricilla Loveless, MD 09/04/18 1023

## 2018-09-11 ENCOUNTER — Encounter (HOSPITAL_COMMUNITY): Payer: Self-pay | Admitting: Emergency Medicine

## 2018-09-11 ENCOUNTER — Emergency Department (HOSPITAL_COMMUNITY)
Admission: EM | Admit: 2018-09-11 | Discharge: 2018-09-11 | Disposition: A | Discharge status: Home / Self Care | Payer: Self-pay | Attending: Emergency Medicine | Admitting: Emergency Medicine

## 2018-09-11 ENCOUNTER — Other Ambulatory Visit: Payer: Self-pay

## 2018-09-11 DIAGNOSIS — F17210 Nicotine dependence, cigarettes, uncomplicated: Secondary | ICD-10-CM | POA: Insufficient documentation

## 2018-09-11 DIAGNOSIS — R109 Unspecified abdominal pain: Secondary | ICD-10-CM | POA: Insufficient documentation

## 2018-09-11 DIAGNOSIS — T360X5A Adverse effect of penicillins, initial encounter: Secondary | ICD-10-CM | POA: Insufficient documentation

## 2018-09-11 DIAGNOSIS — T50905A Adverse effect of unspecified drugs, medicaments and biological substances, initial encounter: Secondary | ICD-10-CM

## 2018-09-11 LAB — URINALYSIS, ROUTINE W REFLEX MICROSCOPIC
Bilirubin Urine: NEGATIVE
Glucose, UA: NEGATIVE mg/dL
Hgb urine dipstick: NEGATIVE
KETONES UR: NEGATIVE mg/dL
Nitrite: NEGATIVE
PROTEIN: NEGATIVE mg/dL
Specific Gravity, Urine: 1.019 (ref 1.005–1.030)
pH: 7 (ref 5.0–8.0)

## 2018-09-11 LAB — LIPASE, BLOOD: Lipase: 22 U/L (ref 11–51)

## 2018-09-11 LAB — COMPREHENSIVE METABOLIC PANEL
ALK PHOS: 94 U/L (ref 38–126)
ALT: 25 U/L (ref 0–44)
AST: 17 U/L (ref 15–41)
Albumin: 3.9 g/dL (ref 3.5–5.0)
Anion gap: 7 (ref 5–15)
BUN: 7 mg/dL (ref 6–20)
CHLORIDE: 106 mmol/L (ref 98–111)
CO2: 24 mmol/L (ref 22–32)
Calcium: 9.3 mg/dL (ref 8.9–10.3)
Creatinine, Ser: 0.61 mg/dL (ref 0.44–1.00)
GFR calc Af Amer: >60 mL/min (ref >60)
GFR calc non Af Amer: >60 mL/min (ref >60)
Glucose, Bld: 90 mg/dL (ref 70–99)
Potassium: 3.7 mmol/L (ref 3.5–5.1)
Sodium: 137 mmol/L (ref 135–145)
Total Bilirubin: 0.3 mg/dL (ref 0.3–1.2)
Total Protein: 7.6 g/dL (ref 6.5–8.1)

## 2018-09-11 LAB — CBC
HCT: 40 % (ref 36.0–46.0)
Hemoglobin: 12.5 g/dL (ref 12.0–15.0)
MCH: 26.5 pg (ref 26.0–34.0)
MCHC: 31.3 g/dL (ref 30.0–36.0)
MCV: 84.9 fL (ref 80.0–100.0)
Platelets: 281 10*3/uL (ref 150–400)
RBC: 4.71 MIL/uL (ref 3.87–5.11)
RDW: 14.1 % (ref 11.5–15.5)
WBC: 8.8 10*3/uL (ref 4.0–10.5)
nRBC: 0 % (ref 0.0–0.2)

## 2018-09-11 LAB — I-STAT BETA HCG BLOOD, ED (MC, WL, AP ONLY)

## 2018-09-11 MED ORDER — CLINDAMYCIN HCL 300 MG PO CAPS: 300.0000 mg | ORAL_CAPSULE | Freq: Four times a day (QID) | ORAL | 0 refills | Status: DC

## 2018-09-11 MED ORDER — SODIUM CHLORIDE 0.9% FLUSH: 3.0000 mL | Freq: Once | INTRAVENOUS | Status: DC

## 2018-09-11 NOTE — ED Provider Notes (Signed)
[facility] EMERGENCY DEPARTMENT Provider Note   CSN: [id number] Arrival date & time: 09/11/18  1143    History   Chief Complaint Chief Complaint  Patient presents with  . Allergic Reaction  . Abdominal Pain    HPI Michelle Shah is a 28 y.o. female.     Patient is a 28 year old female with history of anxiety and anemia.  She presents today for evaluation of a possible medication reaction.  She was prescribed penicillin last week for a dental infection.  She filled this prescription this morning and took the first dose on an empty stomach as she tells me the bottle instructed.  Shortly after taking this medication she developed abdominal cramping, nausea, and feeling sweaty all over.  She denies any rash, throat swelling, or difficulty breathing.  The history is provided by the patient.    Past Medical History:  Diagnosis Date  . Anemia   . Anxiety   . Depression   . Seasonal allergies     Patient Active Problem List   Diagnosis Date Noted  . Indication for care or intervention related to labor and delivery 02/15/2016  . Supervision of normal first pregnancy, antepartum 09/04/2015    Past Surgical History:  Procedure Laterality Date  . NO PAST SURGERIES       OB History    Gravida  1   Para  1   Term  1   Preterm      AB      Living  1     SAB      TAB      Ectopic      Multiple  0   Live Births  1            Home Medications    Prior to Admission medications   Medication Sig Start Date End Date Taking? Authorizing Provider  acetaminophen (TYLENOL) 500 MG tablet Take 1,000 mg by mouth every 6 (six) hours as needed for mild pain, moderate pain or headache.    [provider]  benzonatate (TESSALON) 100 MG capsule Take 1 capsule (100 mg total) by mouth every 8 (eight) hours. 05/22/18   Caccavale, Sophia, PA-C  diclofenac sodium (VOLTAREN) 1 % GEL Apply 2 g topically 4 (four) times daily. 05/22/18   Caccavale,  Sophia, PA-C  ferrous sulfate (FERROUSUL) 325 (65 FE) MG tablet Take 1 tablet (325 mg total) by mouth daily with breakfast. 02/18/16   Katrinka Blazing, [place], CNM  fluticasone [facility]) 50 MCG/ACT nasal spray Place 2 sprays into both nostrils daily. 05/22/18   Caccavale, Sophia, PA-C  ibuprofen (ADVIL,MOTRIN) 600 MG tablet Take 1 tablet (600 mg total) by mouth every 6 (six) hours. 02/18/16   Katrinka Blazing, [place], CNM  penicillin v potassium (VEETID) 500 MG tablet Take 1 tablet (500 mg total) by mouth 4 (four) times daily for 7 days. 09/04/18 09/11/18  Eyvonne Mechanic, PA-C  Prenatal Vit-Min-FA-Fish Oil ([facility] PRENATAL GUMMY PO) Take 2 each by mouth daily.    [provider]  traMADol (ULTRAM) 50 MG tablet Take 1 tablet (50 mg total) by mouth every 6 (six) hours as needed. 09/04/18   Eyvonne Mechanic, PA-C    Family History Family History  Problem Relation Age of Onset  . Diabetes Father   . Diabetes Paternal Aunt   . Diabetes Paternal Uncle     Social History Social History   Tobacco Use  . Smoking status: Current Every Day Smoker    Last attempt  to quit: 08/20/2015    Years since quitting: 3.0  . Smokeless tobacco: Never Used  Substance Use Topics  . Alcohol use: No    Comment: weekly  . Drug use: No     Allergies   Patient has no known allergies.   Review of Systems Review of Systems  All other systems reviewed and are negative.    Physical Exam Updated Vital Signs BP 123/76 (BP Location: Right Arm)   Pulse 92   Temp 98.3 F (36.8 C) (Oral)   Resp 18   Ht 5' (1.524 m)   Wt 104.3 kg   LMP 08/15/2018 (Approximate)   SpO2 100%   BMI 44.92 kg/m   Physical Exam Vitals signs and nursing note reviewed.  Constitutional:      General: She is not in acute distress.    Appearance: She is well-developed. She is not diaphoretic.  HENT:     Head: Normocephalic and atraumatic.     Mouth/Throat:     Mouth: Mucous membranes are moist.     Pharynx: Oropharynx is clear. No  oropharyngeal exudate.  Neck:     Musculoskeletal: Normal range of motion and neck supple.  Cardiovascular:     Rate and Rhythm: Normal rate and regular rhythm.     Heart sounds: No murmur. No friction rub. No gallop.   Pulmonary:     Effort: Pulmonary effort is normal. No respiratory distress.     Breath sounds: Normal breath sounds. No wheezing.  Abdominal:     General: Bowel sounds are normal. There is no distension.     Palpations: Abdomen is soft.     Tenderness: There is abdominal tenderness in the left upper quadrant. There is no guarding or rebound.  Musculoskeletal: Normal range of motion.  Skin:    General: Skin is warm and dry.     Findings: No rash.  Neurological:     Mental Status: She is alert and oriented to person, place, and time.      ED Treatments / Results  Labs (all labs ordered are listed, but only abnormal results are displayed) Labs Reviewed  LIPASE, BLOOD  COMPREHENSIVE METABOLIC PANEL  CBC  URINALYSIS, ROUTINE W REFLEX MICROSCOPIC  I-STAT BETA HCG BLOOD, ED ([facility], [facility], [facility] ONLY)    EKG None  Radiology No results found.  Procedures Procedures (including critical care time)  Medications Ordered in ED Medications  sodium chloride flush (NS) 0.9 % injection 3 mL (has no administration in time range)     Initial Impression / Assessment and Plan / ED Course  I have reviewed the triage vital signs and the nursing notes.  Pertinent labs & imaging results that were available during my care of the patient were reviewed by me and considered in my medical decision making (see chart for details).  Patient is a 28 year old female presenting with complaints of abdominal cramping and feeling sweaty.  This started immediately after taking penicillin on an empty stomach.  This was prescribed for her 1 week ago for dental pain.  She just got the prescription filled this morning.  Patient appears clinically well and in no distress.  Her abdominal exam is  benign.  She has no fever, no white count, normal LFTs and lipase, and unremarkable electrolytes.  I highly doubt an acute intra-abdominal process and I highly suspect a side effect from her medication.  Patient will attempt taking her penicillin with food next time.  If she has no similar response, I will  give her a prescription for clindamycin which she can change her antibiotic to.  This in no way seems like an allergic reaction, but rather a side effect.  I am comfortable with her continuing penicillin since she already got this prescription, but if this happens again she will change to the clindamycin.  Final Clinical Impressions(s) / ED Diagnoses   Final diagnoses:  None    ED Discharge Orders    None       Geoffery Lyonselo, Charisa Twitty, MD 09/11/18 1319

## 2018-09-11 NOTE — ED Triage Notes (Signed)
Seen the other day for dental pain recently and was able to get the prescription filled today. Took one does of penicillin today at 0800 and at 0900 developed sweating with hands first and now entire body with general abdominal pain cramping. Denies shortness of breath does state have a history of anxiety.

## 2018-09-11 NOTE — Discharge Instructions (Addendum)
Continue taking your penicillin, however make sure you take it with food.  If you respond similarly taking this medication with food, stop taking it and fill the prescription with clindamycin you were provided with today.  Return to the ER for worsening abdominal pain, high fever, bloody stool, or other new and concerning symptoms.

## 2021-05-21 ENCOUNTER — Encounter: Payer: Self-pay | Admitting: *Deleted

## 2024-05-03 ENCOUNTER — Telehealth: Admitting: Physician Assistant

## 2024-05-03 DIAGNOSIS — B9689 Other specified bacterial agents as the cause of diseases classified elsewhere: Secondary | ICD-10-CM | POA: Diagnosis not present

## 2024-05-03 DIAGNOSIS — N760 Acute vaginitis: Secondary | ICD-10-CM | POA: Diagnosis not present

## 2024-05-03 MED ORDER — METRONIDAZOLE 500 MG PO TABS: 500.0000 mg | ORAL_TABLET | Freq: Two times a day (BID) | ORAL | 0 refills | Status: AC

## 2024-05-03 NOTE — Patient Instructions (Signed)
 Mica L Olarte, thank you for joining Delon CHRISTELLA Dickinson, PA-C for today's virtual visit.  While this provider is not your primary care provider (PCP), if your PCP is located in our provider database this encounter information will be shared with them immediately following your visit.   A [facility] MyChart account gives you access to today's visit and all your visits, tests, and labs performed at [facility]  click here if you don't have a [facility] MyChart account or go to [email or web address]  Consent: (Patient) Michelle Shah provided verbal consent for this virtual visit at the beginning of the encounter.  Current Medications:  Current Outpatient Medications:    metroNIDAZOLE (FLAGYL) 500 MG tablet, Take 1 tablet (500 mg total) by mouth 2 (two) times daily for 7 days., Disp: 14 tablet, Rfl: 0   acetaminophen  (TYLENOL ) 500 MG tablet, Take 1,000 mg by mouth every 6 (six) hours as needed for mild pain, moderate pain or headache., Disp: , Rfl:    benzonatate  (TESSALON ) 100 MG capsule, Take 1 capsule (100 mg total) by mouth every 8 (eight) hours., Disp: 21 capsule, Rfl: 0   diclofenac  sodium (VOLTAREN ) 1 % GEL, Apply 2 g topically 4 (four) times daily., Disp: 100 g, Rfl: 0   ferrous sulfate  (FERROUSUL) 325 (65 FE) MG tablet, Take 1 tablet (325 mg total) by mouth daily with breakfast., Disp: 60 tablet, Rfl: 0   fluticasone  (FLONASE ) 50 MCG/ACT nasal spray, Place 2 sprays into both nostrils daily., Disp: 16 g, Rfl: 0   ibuprofen  (ADVIL ,MOTRIN ) 600 MG tablet, Take 1 tablet (600 mg total) by mouth every 6 (six) hours., Disp: 30 tablet, Rfl: 0   Prenatal Vit-Min-FA-Fish Oil ([facility] PRENATAL GUMMY PO), Take 2 each by mouth daily., Disp: , Rfl:    traMADol  (ULTRAM ) 50 MG tablet, Take 1 tablet (50 mg total) by mouth every 6 (six) hours as needed., Disp: 6 tablet, Rfl: 0   Medications ordered in this encounter:  Meds ordered this encounter  Medications   metroNIDAZOLE (FLAGYL)  500 MG tablet    Sig: Take 1 tablet (500 mg total) by mouth 2 (two) times daily for 7 days.    Dispense:  14 tablet    Refill:  0    Supervising Provider:   BLAISE ALEENE KIDD [[id number]]     *If you need refills on other medications prior to your next appointment, please contact your pharmacy*  Follow-Up: Call back or seek an in-person evaluation if the symptoms worsen or if the condition fails to improve as anticipated.  [facility] [phone]  Other Instructions Vaginal Probiotics: AZO vaginal probiotic OLLY Happy Hoo-Ha RAW Vaginal Care RenewLife Women's vaginal probiotic RepHresh Pro-B  Vaginal washes: Honey Pot Summer's Eve Vagisil Feminine cleanser  Boric Acid Suppositories  Healthy vaginal hygiene practices    -  Avoid sleeper pajamas. Nightgowns allow air to circulate.  Sleep without underpants whenever possible.   -  Wear cotton underpants during the day. Double-rinse underwear after washing to avoid residual irritants. Do not use fabric softeners for underwear and swimsuits.   - Avoid tights, leotards, leggings, skinny jeans, and other tight-fitting clothing. Skirts and loose-fitting pants allow air to circulate.   - Avoid pantyliners.  Instead use tampons or cotton pads.   - Use the restroom after intercourse to help prevent UTI's   - Daily warm bathing is helpful:     - Soak in clean water (no soap) for 10 to 15 minutes. Adding vinegar  or baking soda to the water has not been specifically studied and may not be better than clean water alone.      - Use soap to wash regions other than the genital area just before getting out of the tub. Limit use of any soap on genital areas. Use fragance-free soaps.     - Rinse the genital area well and gently pat dry.  Don't rub.  Hair dryer to assist with drying can be used only if on cool setting.     - Do not use bubble baths or perfumed soaps.   - Do not use any feminine sprays, douches or powders.   These contain chemicals that will irritate the skin.   - If the genital area is tender or swollen, cool compresses may relieve the discomfort. Unscented wet wipes can be used instead of toilet paper for wiping.    - Emollients, such as Vaseline, may help protect skin and can be applied to the irritated area.   - Always remember to wipe front-to-back after bowel movements. Pat dry after urination.   - Do not sit in wet swimsuits for long periods of time after swimming    If you have been instructed to have an in-person evaluation today at a local Urgent Care facility, please use the link below. It will take you to a list of all of our available [facility], including address, phone number and hours of operation. Please do not delay care.  [facility]  If you or a family member do not have a primary care provider, use the link below to schedule a visit and establish care. When you choose a [facility] primary care physician or advanced practice provider, you gain a long-term partner in health. Find a Primary Care Provider  Learn more about [facility] in-office and virtual care options: [facility] - Get Care Now

## 2024-05-03 NOTE — Progress Notes (Signed)
 Virtual Visit Consent   Michelle Shah, you are scheduled for a virtual visit with a [facility] provider today. Just as with appointments in the office, your consent must be obtained to participate. Your consent will be active for this visit and any virtual visit you may have with one of our providers in the next 365 days. If you have a MyChart account, a copy of this consent can be sent to you electronically.  As this is a virtual visit, video technology does not allow for your provider to perform a traditional examination. This may limit your provider's ability to fully assess your condition. If your provider identifies any concerns that need to be evaluated in person or the need to arrange testing (such as labs, EKG, etc.), we will make arrangements to do so. Although advances in technology are sophisticated, we cannot ensure that it will always work on either your end or our end. If the connection with a video visit is poor, the visit may have to be switched to a telephone visit. With either a video or telephone visit, we are not always able to ensure that we have a secure connection.  By engaging in this virtual visit, you consent to the provision of healthcare and authorize for your insurance to be billed (if applicable) for the services provided during this visit. Depending on your insurance coverage, you may receive a charge related to this service.  I need to obtain your verbal consent now. Are you willing to proceed with your visit today? Michelle Shah has provided verbal consent on 05/03/2024 for a virtual visit (video or telephone). Delon CHRISTELLA Dickinson, PA-C  Date: 05/03/2024 7:20 PM   Virtual Visit via Video Note   I, Delon CHRISTELLA Dickinson, connected with  Michelle Shah  ([id number], [birth year]) on 05/03/24 at  7:00 PM EDT by a video-enabled telemedicine application and verified that I am speaking with the correct person using two identifiers.  Location: Patient: Virtual Visit  Location Patient: Home Provider: Virtual Visit Location Provider: Home Office   I discussed the limitations of evaluation and management by telemedicine and the availability of in person appointments. The patient expressed understanding and agreed to proceed.    History of Present Illness: Michelle Shah is a 33 y.o. who identifies as a female who was assigned female at birth, and is being seen today for vaginal issue.  HPI: Vaginal Discharge The patient's primary symptoms include a genital odor and vaginal discharge. The patient's pertinent negatives include no genital itching. This is a new problem. The current episode started 1 to 4 weeks ago (started about a month ago). The problem occurs constantly. The problem has been waxing and waning. The patient is experiencing no pain. She is not pregnant. Pertinent negatives include no abdominal pain, chills, fever or nausea. The vaginal discharge was white, thin, milky and malodorous. There has been no bleeding. She has not been passing clots. She has not been passing tissue. Nothing aggravates the symptoms. Treatments tried: boric acid suppositories. The treatment provided no relief.     Problems:  Patient Active Problem List   Diagnosis Date Noted   Indication for care or intervention related to labor and delivery 02/15/2016   Supervision of normal first pregnancy, antepartum 09/04/2015    Allergies: No Known Allergies Medications:  Current Outpatient Medications:    acetaminophen  (TYLENOL ) 500 MG tablet, Take 1,000 mg by mouth every 6 (six) hours as needed for mild pain, moderate pain or headache., Disp: ,  Rfl:    benzonatate  (TESSALON ) 100 MG capsule, Take 1 capsule (100 mg total) by mouth every 8 (eight) hours., Disp: 21 capsule, Rfl: 0   diclofenac  sodium (VOLTAREN ) 1 % GEL, Apply 2 g topically 4 (four) times daily., Disp: 100 g, Rfl: 0   ferrous sulfate  (FERROUSUL) 325 (65 FE) MG tablet, Take 1 tablet (325 mg total) by mouth daily with  breakfast., Disp: 60 tablet, Rfl: 0   fluticasone  (FLONASE ) 50 MCG/ACT nasal spray, Place 2 sprays into both nostrils daily., Disp: 16 g, Rfl: 0   ibuprofen  (ADVIL ,MOTRIN ) 600 MG tablet, Take 1 tablet (600 mg total) by mouth every 6 (six) hours., Disp: 30 tablet, Rfl: 0   metroNIDAZOLE (FLAGYL) 500 MG tablet, Take 1 tablet (500 mg total) by mouth 2 (two) times daily for 7 days., Disp: 14 tablet, Rfl: 0   Prenatal Vit-Min-FA-Fish Oil ([employer] PRENATAL GUMMY PO), Take 2 each by mouth daily., Disp: , Rfl:    traMADol  (ULTRAM ) 50 MG tablet, Take 1 tablet (50 mg total) by mouth every 6 (six) hours as needed., Disp: 6 tablet, Rfl: 0  Observations/Objective: Patient is well-developed, well-nourished in no acute distress.  Resting comfortably at home.  Head is normocephalic, atraumatic.  No labored breathing.  Speech is clear and coherent with logical content.  Patient is alert and oriented at baseline.    Assessment and Plan: 1. BV (bacterial vaginosis) (Primary) - metroNIDAZOLE (FLAGYL) 500 MG tablet; Take 1 tablet (500 mg total) by mouth 2 (two) times daily for 7 days.  Dispense: 14 tablet; Refill: 0  - Symptoms consistent with BV - Metronidazole prescribed - Limit bubble baths, scented lotions/soaps/detergents - Limit tight fitting clothing - Seek on person evaluation if not improving or if symptoms worsen   Follow Up Instructions: I discussed the assessment and treatment plan with the patient. The patient was provided an opportunity to ask questions and all were answered. The patient agreed with the plan and demonstrated an understanding of the instructions.  A copy of instructions were sent to the patient via MyChart unless otherwise noted below.    The patient was advised to call back or seek an in-person evaluation if the symptoms worsen or if the condition fails to improve as anticipated.    Delon CHRISTELLA Dickinson, PA-C

## 2024-07-05 ENCOUNTER — Ambulatory Visit (HOSPITAL_BASED_OUTPATIENT_CLINIC_OR_DEPARTMENT_OTHER): Payer: Self-pay
